# Patient Record
Sex: Female | Born: 1990 | Hispanic: No | Marital: Married | Smoking: Never smoker
Health system: Southern US, Community
[De-identification: ages and names within clinical notes are randomized; demographics above are authoritative.]

## PROBLEM LIST (undated history)

## (undated) ENCOUNTER — Inpatient Hospital Stay (HOSPITAL_COMMUNITY): Payer: Self-pay

## (undated) HISTORY — PX: BACK SURGERY: SHX140

## (undated) HISTORY — PX: CYST EXCISION: SHX5701

---

## 2010-06-09 ENCOUNTER — Ambulatory Visit: Payer: Self-pay | Admitting: Internal Medicine

## 2011-05-03 ENCOUNTER — Observation Stay: Payer: Self-pay

## 2011-07-01 ENCOUNTER — Emergency Department: Payer: Self-pay | Admitting: Emergency Medicine

## 2011-07-29 ENCOUNTER — Observation Stay: Payer: Self-pay | Admitting: Obstetrics and Gynecology

## 2011-08-14 ENCOUNTER — Observation Stay: Payer: Self-pay

## 2011-09-01 ENCOUNTER — Inpatient Hospital Stay: Payer: Self-pay | Admitting: Obstetrics and Gynecology

## 2013-11-09 ENCOUNTER — Encounter (HOSPITAL_COMMUNITY): Payer: Self-pay | Admitting: Emergency Medicine

## 2013-11-09 ENCOUNTER — Emergency Department (HOSPITAL_COMMUNITY)
Admission: EM | Admit: 2013-11-09 | Discharge: 2013-11-09 | Disposition: A | Discharge status: Home / Self Care | Payer: 59 | Source: Home / Self Care | Attending: Family Medicine | Admitting: Family Medicine

## 2013-11-09 DIAGNOSIS — M778 Other enthesopathies, not elsewhere classified: Secondary | ICD-10-CM

## 2013-11-09 DIAGNOSIS — M779 Enthesopathy, unspecified: Secondary | ICD-10-CM

## 2013-11-09 DIAGNOSIS — M2580 Other specified joint disorders, unspecified joint: Secondary | ICD-10-CM

## 2013-11-09 DIAGNOSIS — M659 Synovitis and tenosynovitis, unspecified: Secondary | ICD-10-CM

## 2013-11-09 DIAGNOSIS — M948X9 Other specified disorders of cartilage, unspecified sites: Secondary | ICD-10-CM

## 2013-11-09 MED ORDER — ETODOLAC 500 MG PO TABS: 500.0000 mg | ORAL_TABLET | Freq: Two times a day (BID) | ORAL | Status: DC

## 2013-11-09 NOTE — ED Notes (Signed)
Pt triaged and assessed by provider.   Provider in before nurse. 

## 2013-11-09 NOTE — Discharge Instructions (Signed)
Tendinitis °Tendinitis is swelling and inflammation of the tendons. Tendons are band-like tissues that connect muscle to bone. Tendinitis commonly occurs in the:  °· Shoulders (rotator cuff). °· Heels (Achilles tendon). °· Elbows (triceps tendon). °CAUSES °Tendinitis is usually caused by overusing the tendon, muscles, and joints involved. When the tissue surrounding a tendon (synovium) becomes inflamed, it is called tenosynovitis. Tendinitis commonly develops in people whose jobs require repetitive motions. °SYMPTOMS °· Pain. °· Tenderness. °· Mild swelling. °DIAGNOSIS °Tendinitis is usually diagnosed by physical exam. Your caregiver may also order X-rays or other imaging tests. °TREATMENT °Your caregiver may recommend certain medicines or exercises for your treatment. °HOME CARE INSTRUCTIONS  °· Use a sling or splint for as long as directed by your caregiver until the pain decreases. °· Put ice on the injured area. °· Put ice in a plastic bag. °· Place a towel between your skin and the bag. °· Leave the ice on for 15-20 minutes, 03-04 times a day. °· Avoid using the limb while the tendon is painful. Perform gentle range of motion exercises only as directed by your caregiver. Stop exercises if pain or discomfort increase, unless directed otherwise by your caregiver. °· Only take over-the-counter or prescription medicines for pain, discomfort, or fever as directed by your caregiver. °SEEK MEDICAL CARE IF:  °· Your pain and swelling increase. °· You develop new, unexplained symptoms, especially increased numbness in the hands. °MAKE SURE YOU:  °· Understand these instructions. °· Will watch your condition. °· Will get help right away if you are not doing well or get worse. °Document Released: 09/03/2000 Document Revised: 11/29/2011 Document Reviewed: 11/23/2010 °[employer]® Patient Information ©2014 [employer]. ° °

## 2013-11-09 NOTE — ED Provider Notes (Signed)
CSN: [id number]     Arrival date & time 11/09/13  1732 History   First MD Initiated Contact with Patient 11/09/13 1827     Chief Complaint  Patient presents with  . Foot Pain     (Consider location/radiation/quality/duration/timing/severity/associated sxs/prior Treatment) HPI Comments: 23 year old female presents complaining of left foot pain. For 2 days, she has had progressively worsening pain under the distal first metatarsal of the left foot. The pain radiates proximally up her foot to the mid arch. Her pain is somewhat relieved by walking on the side of her foot. She has had something similar to this on her other foot a few months ago but it never got this bad and went away without treatment. She states that she is on her feet a lot at work. She denies any new shoes. Denies any known injury. She feels like there is some mild swelling in her foot. She has tried elevating the foot and using ibuprofen without any significant relief of her symptoms.   History reviewed. No pertinent past medical history. Past Surgical History  Procedure Laterality Date  . Back surgery    . Cesarean section     History reviewed. No pertinent family history. History  Substance Use Topics  . Smoking status: Never Smoker   . Smokeless tobacco: Not on file  . Alcohol Use: No   OB History   Grav Para Term Preterm Abortions TAB SAB Ect Mult Living                 Review of Systems  Constitutional: Negative for fever and chills.  Eyes: Negative for visual disturbance.  Respiratory: Negative for cough and shortness of breath.   Cardiovascular: Negative for chest pain, palpitations and leg swelling.  Gastrointestinal: Negative for nausea, vomiting and abdominal pain.  Endocrine: Negative for polydipsia and polyuria.  Genitourinary: Negative for dysuria, urgency and frequency.  Musculoskeletal:       See history of present illness  Skin: Negative for rash.  Neurological: Negative for dizziness,  weakness and light-headedness.      Allergies  Tbc  Home Medications   Current Outpatient Rx  Name  Route  Sig  Dispense  Refill  . etodolac (LODINE) 500 MG tablet   Oral   Take 1 tablet (500 mg total) by mouth 2 (two) times daily.   30 tablet   1    BP 104/69  Pulse 72  Temp(Src) 98.4 F (36.9 C) (Oral)  Resp 14  SpO2 98%  LMP 10/18/2013 Physical Exam  Nursing note and vitals reviewed. Constitutional: She is oriented to person, place, and time. Vital signs are normal. She appears well-developed and well-nourished. No distress.  HENT:  Head: Normocephalic and atraumatic.  Pulmonary/Chest: Effort normal. No respiratory distress.  Musculoskeletal:       Left foot: She exhibits tenderness (tenderness directly under the head of the distal first metatarsal. Mild tenderness with palpation of the flexor hallucis longus tendon with extension of the great toe) and swelling (very minimal, around the distal first metatarsal). She exhibits normal range of motion, normal capillary refill and no deformity.  Neurological: She is alert and oriented to person, place, and time. She has normal strength. Coordination normal.  Skin: Skin is warm and dry. No rash noted. She is not diaphoretic.  Psychiatric: She has a normal mood and affect. Judgment normal.    ED Course  Procedures (including critical care time) Labs Review Labs Reviewed - No data to display Imaging Review No  results found.    MDM   Final diagnoses:  Sesamoiditis  Flexor hallucis longus tendinitis    Discussed with Dr. Denyse Amass, will put in a postop shoe and referred to orthopedics/sports medicine, prescribed etodolac to take twice daily. Advised ice and elevation.     Meds ordered this encounter  Medications  . etodolac (LODINE) 500 MG tablet    Sig: Take 1 tablet (500 mg total) by mouth 2 (two) times daily.    Dispense:  30 tablet    Refill:  1    Order Specific Question:  Supervising Provider    Answer:   Clementeen Graham, Kathie Rhodes [[id number]]      Graylon Good, PA-C 11/10/13 [phone]

## 2013-11-11 NOTE — ED Provider Notes (Signed)
Medical screening examination/treatment/procedure(s) were performed by a resident physician or non-physician practitioner and as the supervising physician I was immediately available for consultation/collaboration.  Shamyia Grandpre, MD    Lalanya Rufener S Dale Ribeiro, MD 11/11/13 0832 

## 2014-02-05 LAB — OB RESULTS CONSOLE ABO/RH: RH Type: POSITIVE

## 2014-02-05 LAB — OB RESULTS CONSOLE HIV ANTIBODY (ROUTINE TESTING): HIV: NONREACTIVE

## 2014-02-05 LAB — OB RESULTS CONSOLE HGB/HCT, BLOOD: Hemoglobin: 13.5 g/dL

## 2014-02-05 LAB — OB RESULTS CONSOLE HEPATITIS B SURFACE ANTIGEN: HEP B S AG: NEGATIVE

## 2014-02-05 LAB — OB RESULTS CONSOLE RUBELLA ANTIBODY, IGM: Rubella: IMMUNE

## 2014-02-05 LAB — OB RESULTS CONSOLE GC/CHLAMYDIA
CHLAMYDIA, DNA PROBE: NEGATIVE
GC PROBE AMP, GENITAL: NEGATIVE

## 2014-03-28 ENCOUNTER — Emergency Department (HOSPITAL_COMMUNITY)
Admission: EM | Admit: 2014-03-28 | Discharge: 2014-03-29 | Disposition: A | Discharge status: Home / Self Care | Payer: 59 | Attending: Emergency Medicine | Admitting: Emergency Medicine

## 2014-03-28 ENCOUNTER — Encounter (HOSPITAL_COMMUNITY): Payer: Self-pay | Admitting: Emergency Medicine

## 2014-03-28 DIAGNOSIS — K8020 Calculus of gallbladder without cholecystitis without obstruction: Secondary | ICD-10-CM | POA: Insufficient documentation

## 2014-03-28 DIAGNOSIS — O9989 Other specified diseases and conditions complicating pregnancy, childbirth and the puerperium: Secondary | ICD-10-CM | POA: Insufficient documentation

## 2014-03-28 DIAGNOSIS — K8050 Calculus of bile duct without cholangitis or cholecystitis without obstruction: Secondary | ICD-10-CM

## 2014-03-28 DIAGNOSIS — Z79899 Other long term (current) drug therapy: Secondary | ICD-10-CM | POA: Insufficient documentation

## 2014-03-28 LAB — BASIC METABOLIC PANEL
ANION GAP: 15 (ref 5–15)
BUN: 5 mg/dL — ABNORMAL LOW (ref 6–23)
CO2: 23 mEq/L (ref 19–32)
Calcium: 9.3 mg/dL (ref 8.4–10.5)
Chloride: 98 mEq/L (ref 96–112)
Creatinine, Ser: 0.53 mg/dL (ref 0.50–1.10)
GFR calc Af Amer: >90 mL/min (ref >90)
GLUCOSE: 89 mg/dL (ref 70–99)
Potassium: 3.9 mEq/L (ref 3.7–5.3)
SODIUM: 136 meq/L — AB (ref 137–147)

## 2014-03-28 LAB — CBC
HCT: 38.6 % (ref 36.0–46.0)
HEMOGLOBIN: 13.5 g/dL (ref 12.0–15.0)
MCH: 31.9 pg (ref 26.0–34.0)
MCHC: 35 g/dL (ref 30.0–36.0)
MCV: 91.3 fL (ref 78.0–100.0)
Platelets: 304 10*3/uL (ref 150–400)
RBC: 4.23 MIL/uL (ref 3.87–5.11)
RDW: 12.4 % (ref 11.5–15.5)
WBC: 12.8 10*3/uL — ABNORMAL HIGH (ref 4.0–10.5)

## 2014-03-28 LAB — I-STAT TROPONIN, ED: Troponin i, poc: 0 ng/mL (ref 0.00–0.08)

## 2014-03-28 NOTE — ED Notes (Signed)
Pt also reports that she is [redacted] weeks pregnant

## 2014-03-28 NOTE — ED Notes (Signed)
Pt states that she began to have right sided chest pain under rt rib area below the breast area that radiates to her neck around 7pm; pt states that the pain is worse when she takes a deep breathe; pt states that she feels short of breath; pt states that she was driving when it began and has been persistent since then

## 2014-03-29 ENCOUNTER — Emergency Department (HOSPITAL_COMMUNITY): Payer: 59

## 2014-03-29 LAB — HEPATIC FUNCTION PANEL
ALBUMIN: 3.5 g/dL (ref 3.5–5.2)
ALT: 13 U/L (ref 0–35)
AST: 19 U/L (ref 0–37)
Alkaline Phosphatase: 72 U/L (ref 39–117)
BILIRUBIN TOTAL: 0.2 mg/dL — AB (ref 0.3–1.2)
Total Protein: 7.3 g/dL (ref 6.0–8.3)

## 2014-03-29 LAB — LIPASE, BLOOD: Lipase: 50 U/L (ref 11–59)

## 2014-03-29 MED ORDER — PROMETHAZINE HCL 25 MG PO TABS: 25.0000 mg | ORAL_TABLET | Freq: Four times a day (QID) | ORAL | Status: DC | PRN

## 2014-03-29 MED ORDER — ACETAMINOPHEN 325 MG PO TABS
650.0000 mg | ORAL_TABLET | Freq: Once | ORAL | Status: AC
  Administered 2014-03-29: 650 mg via ORAL
  Filled 2014-03-29: qty 2

## 2014-03-29 NOTE — Discharge Instructions (Signed)
Your ultrasound today showed some sludge in your gallbladder, but no gallstones.  No signs of gallbladder infection.  Stick to a low-fat diet.  Follow up with your primary care Dr. and/or your OB for further workup of this ongoing pain.  Return to the emergency room for worsening condition or new concerning symptoms.   Biliary Colic  Biliary colic is a steady or irregular pain in the upper abdomen. It is usually under the right side of the rib cage. It happens when gallstones interfere with the normal flow of bile from the gallbladder. Bile is a liquid that helps to digest fats. Bile is made in the liver and stored in the gallbladder. When you eat a meal, bile passes from the gallbladder through the cystic duct and the common bile duct into the small intestine. There, it mixes with partially digested food. If a gallstone blocks either of these ducts, the normal flow of bile is blocked. The muscle cells in the bile duct contract forcefully to try to move the stone. This causes the pain of biliary colic.  SYMPTOMS   A person with biliary colic usually complains of pain in the upper abdomen. This pain can be:  In the center of the upper abdomen just below the breastbone.  In the upper-right part of the abdomen, near the gallbladder and liver.  Spread back toward the right shoulder blade.  Nausea and vomiting.  The pain usually occurs after eating.  Biliary colic is usually triggered by the digestive system's demand for bile. The demand for bile is high after fatty meals. Symptoms can also occur when a person who has been fasting suddenly eats a very large meal. Most episodes of biliary colic pass after 1 to 5 hours. After the most intense pain passes, your abdomen may continue to ache mildly for about 24 hours. DIAGNOSIS  After you describe your symptoms, your caregiver will perform a physical exam. He or she will pay attention to the upper right portion of your belly (abdomen). This is the area of  your liver and gallbladder. An ultrasound will help your caregiver look for gallstones. Specialized scans of the gallbladder may also be done. Blood tests may be done, especially if you have fever or if your pain persists. PREVENTION  Biliary colic can be prevented by controlling the risk factors for gallstones. Some of these risk factors, such as heredity, increasing age, and pregnancy are a normal part of life. Obesity and a high-fat diet are risk factors you can change through a healthy lifestyle. Women going through menopause who take hormone replacement therapy (estrogen) are also more likely to develop biliary colic. TREATMENT   Pain medication may be prescribed.  You may be encouraged to eat a fat-free diet.  If the first episode of biliary colic is severe, or episodes of colic keep retuning, surgery to remove the gallbladder (cholecystectomy) is usually recommended. This procedure can be done through small incisions using an instrument called a laparoscope. The procedure often requires a brief stay in the hospital. Some people can leave the hospital the same day. It is the most widely used treatment in people troubled by painful gallstones. It is effective and safe, with no complications in more than 90% of cases.  If surgery cannot be done, medication that dissolves gallstones may be used. This medication is expensive and can take months or years to work. Only small stones will dissolve.  Rarely, medication to dissolve gallstones is combined with a procedure called shock-wave lithotripsy.  This procedure uses carefully aimed shock waves to break up gallstones. In many people treated with this procedure, gallstones form again within a few years. PROGNOSIS  If gallstones block your cystic duct or common bile duct, you are at risk for repeated episodes of biliary colic. There is also a 25% chance that you will develop a gallbladder infection(acute cholecystitis), or some other complication of  gallstones within 10 to 20 years. If you have surgery, schedule it at a time that is convenient for you and at a time when you are not sick. HOME CARE INSTRUCTIONS   Drink plenty of clear fluids.  Avoid fatty, greasy or fried foods, or any foods that make your pain worse.  Take medications as directed. SEEK MEDICAL CARE IF:   You develop a fever over 100.5 F (38.1 C).  Your pain gets worse over time.  You develop nausea that prevents you from eating and drinking.  You develop vomiting. SEEK IMMEDIATE MEDICAL CARE IF:   You have continuous or severe belly (abdominal) pain which is not relieved with medications.  You develop nausea and vomiting which is not relieved with medications.  You have symptoms of biliary colic and you suddenly develop a fever and shaking chills. This may signal cholecystitis. Call your caregiver immediately.  You develop a yellow color to your skin or the white part of your eyes (jaundice). Document Released: 02/07/2006 Document Revised: 11/29/2011 Document Reviewed: 04/18/2008 [facility] Patient Information 2015 [place], [place]. This information is not intended to replace advice given to you by your health care provider. Make sure you discuss any questions you have with your health care provider.  Low-Fat Diet for Pancreatitis or Gallbladder Conditions A low-fat diet can be helpful if you have pancreatitis or a gallbladder condition. With these conditions, your pancreas and gallbladder have trouble digesting fats. A healthy eating plan with less fat will help rest your pancreas and gallbladder and reduce your symptoms. WHAT DO I NEED TO KNOW ABOUT THIS DIET?  Eat a low-fat diet.  Reduce your fat intake to less than 20-30% of your total daily calories. This is less than 50-60 g of fat per day.  Remember that you need some fat in your diet. Ask your dietician what your daily goal should be.  Choose nonfat and low-fat healthy foods. Look for the words  "nonfat," "low fat," or "fat free."  As a guide, look on the label and choose foods with less than 3 g of fat per serving. Eat only one serving.  Avoid alcohol.  Do not smoke. If you need help quitting, talk with your health care provider.  Eat small frequent meals instead of three large heavy meals. WHAT FOODS CAN I EAT? Grains Include healthy grains and starches such as potatoes, wheat bread, fiber-rich cereal, and brown rice. Choose whole grain options whenever possible. In adults, whole grains should account for 45-65% of your daily calories.  Fruits and Vegetables Eat plenty of fruits and vegetables. Fresh fruits and vegetables add fiber to your diet. Meats and Other Protein Sources Eat lean meat such as chicken and pork. Trim any fat off of meat before cooking it. Eggs, fish, and beans are other sources of protein. In adults, these foods should account for 10-35% of your daily calories. Dairy Choose low-fat milk and dairy options. Dairy includes fat and protein, as well as calcium.  Fats and Oils Limit high-fat foods such as fried foods, sweets, baked goods, sugary drinks.  Other Creamy sauces and condiments, such  as mayonnaise, can add extra fat. Think about whether or not you need to use them, or use smaller amounts or low fat options. WHAT FOODS ARE NOT RECOMMENDED?  High fat foods, such as:  [employer].  Ice cream.  [place] toast.  Sweet rolls.  Pizza.  Cheese bread.  Foods covered with batter, butter, creamy sauces, or cheese.  Fried foods.  Sugary drinks and desserts.  Foods that cause gas or bloating Document Released: 09/11/2013 Document Reviewed: 08/20/2013 [facility] Patient Information 2015 [place], [place]. This information is not intended to replace advice given to you by your health care provider. Make sure you discuss any questions you have with your health care provider.

## 2014-03-29 NOTE — ED Provider Notes (Signed)
CSN: [id number]     Arrival date & time 03/28/14  2153 History   First MD Initiated Contact with Patient 03/28/14 2354     Chief Complaint  Patient presents with  . Chest Pain     (Consider location/radiation/quality/duration/timing/severity/associated sxs/prior Treatment) HPI 23 year old female presents to emergency department with complaint of right upper quadrant pain with radiation into her chest and neck today while driving home from work.  Patient reports she has had intermittent right upper quadrant pain ongoing for the last several weeks to months usually after eating.  She does not usually have radiation into her chest.  She was concerned with this pain and came to the ER.  She reports the chest and neck pain have since resolved.  While she was having the severe pain, deep breaths would cause her right upper quadrant pain to worsen.  Patient is [redacted] weeks pregnant.  She denies any problems during this pregnancy.  Patient reports no fever or chills.  She is mildly nauseated. History reviewed. No pertinent past medical history. Past Surgical History  Procedure Laterality Date  . Back surgery    . Cesarean section     No family history on file. History  Substance Use Topics  . Smoking status: Never Smoker   . Smokeless tobacco: Not on file  . Alcohol Use: No   OB History   Grav Para Term Preterm Abortions TAB SAB Ect Mult Living   1              Review of Systems  See History of Present Illness; otherwise all other systems are reviewed and negative   Allergies  Tbc  Home Medications   Prior to Admission medications   Medication Sig Start Date End Date Taking? Authorizing Provider  Doxylamine-Pyridoxine (DICLEGIS) 10-10 MG TBEC Take 1 tablet by mouth 2 (two) times daily as needed (nausea).   Yes Historical Provider, MD  Prenatal Vit-Fe Fumarate-FA (MULTIVITAMIN-PRENATAL) 27-0.8 MG TABS tablet Take 1 tablet by mouth daily at 12 noon.   Yes Historical Provider, MD   BP  99/58  Temp(Src) 98.4 F (36.9 C) (Oral)  Resp 17  Ht 5\' 6"  (1.676 m)  Wt 152 lb (68.947 kg)  BMI 24.55 kg/m2  SpO2 100%  LMP 10/18/2013 Physical Exam  Nursing note and vitals reviewed. Constitutional: She is oriented to person, place, and time. She appears well-developed and well-nourished. No distress.  HENT:  Head: Normocephalic and atraumatic.  Nose: Nose normal.  Mouth/Throat: Oropharynx is clear and moist.  Eyes: Conjunctivae and EOM are normal. Pupils are equal, round, and reactive to light.  Neck: Normal range of motion. Neck supple. No JVD present. No tracheal deviation present. No thyromegaly present.  Cardiovascular: Normal rate, regular rhythm, normal heart sounds and intact distal pulses.  Exam reveals no gallop and no friction rub.   No murmur heard. Pulmonary/Chest: Effort normal and breath sounds normal. No stridor. No respiratory distress. She has no wheezes. She has no rales. She exhibits no tenderness.  Abdominal: Soft. Bowel sounds are normal. She exhibits mass (gravid uterus). She exhibits no distension. There is tenderness (tenderness in right upper quadrant). There is no rebound and no guarding.  Musculoskeletal: Normal range of motion. She exhibits no edema and no tenderness.  Lymphadenopathy:    She has no cervical adenopathy.  Neurological: She is alert and oriented to person, place, and time. She exhibits normal muscle tone. Coordination normal.  Skin: Skin is warm and dry. No rash noted. No erythema.  No pallor.  Psychiatric: She has a normal mood and affect. Her behavior is normal. Judgment and thought content normal.    ED Course  Procedures (including critical care time) Labs Review Labs Reviewed  CBC - Abnormal; Notable for the following:    WBC 12.8 (*)    All other components within normal limits  BASIC METABOLIC PANEL - Abnormal; Notable for the following:    Sodium 136 (*)    BUN 5 (*)    All other components within normal limits  HEPATIC  FUNCTION PANEL - Abnormal; Notable for the following:    Total Bilirubin 0.2 (*)    All other components within normal limits  LIPASE, BLOOD  I-STAT TROPOININ, ED    Imaging Review [place] Abdomen Limited  03/29/2014   CLINICAL DATA:  Postprandial right upper quadrant pain.  EXAM: US ABDOMEN LIMITED - RIGHT UPPER QUADRANT  COMPARISON:  None.  FINDINGS: Gallbladder:  Layering gallbladder sludge noted, however no discrete gallstones are identified. No evidence of gallbladder wall thickening or pericholecystic fluid. No sonographic Murphy sign noted by sonographer.  Common bile duct:  Diameter: 4 mm  Liver:  No focal lesion identified. Within normal limits in parenchymal echogenicity.  IMPRESSION: Gallbladder sludge, without discrete gallstones or biliary dilatation.   Electronically Signed   By: Myles RosenthalJohn  Stahl M.D.   On: 03/29/2014 01:20     EKG Interpretation   Date/Time:  Thursday March 28 2014 22:09:22 EDT Ventricular Rate:  74 PR Interval:  137 QRS Duration: 81 QT Interval:  394 QTC Calculation: 437 R Axis:   43 Text Interpretation:  Sinus rhythm Low voltage, precordial leads No old  tracing to compare Confirmed by Rutger Salton  MD, Melchor Kirchgessner ([id number]) on 03/29/2014  12:04:09 AM      MDM   Final diagnoses:  Biliary colic    23 year old female with intermittent pain over the last several months, sounds to be biliary colic, has family history of same.  Patient is a worse, with shortness of breath and right-sided chest pain.  Patient reports all symptoms have resolved.  She is not tachycardic,tachpneic, or in any respiratory distress.  Suspect referred pain from cholelithiasis.  Plan for LFTs, right upper quadrant ultrasound.    Olivia Mackielga M Anays [place], MD 03/29/14 [id number]

## 2014-07-22 ENCOUNTER — Encounter (HOSPITAL_COMMUNITY): Payer: Self-pay | Admitting: Emergency Medicine

## 2014-08-06 ENCOUNTER — Inpatient Hospital Stay (HOSPITAL_COMMUNITY)
Admission: AD | Admit: 2014-08-06 | Discharge: 2014-08-06 | Disposition: A | Discharge status: Home / Self Care | Payer: 59 | Source: Ambulatory Visit | Attending: Obstetrics and Gynecology | Admitting: Obstetrics and Gynecology

## 2014-08-06 ENCOUNTER — Encounter (HOSPITAL_COMMUNITY): Payer: Self-pay | Admitting: *Deleted

## 2014-08-06 DIAGNOSIS — O4703 False labor before 37 completed weeks of gestation, third trimester: Secondary | ICD-10-CM | POA: Insufficient documentation

## 2014-08-06 DIAGNOSIS — Z3A35 35 weeks gestation of pregnancy: Secondary | ICD-10-CM | POA: Insufficient documentation

## 2014-08-06 DIAGNOSIS — O479 False labor, unspecified: Secondary | ICD-10-CM

## 2014-08-06 MED ORDER — ZOLPIDEM TARTRATE 5 MG PO TABS: 5.0000 mg | ORAL_TABLET | Freq: Every evening | ORAL | Status: DC | PRN

## 2014-08-06 MED ORDER — ZOLPIDEM TARTRATE 5 MG PO TABS: 5.0000 mg | ORAL_TABLET | Freq: Once | ORAL | Status: DC

## 2014-08-06 NOTE — MAU Note (Signed)
Dr. Henderson Cloudomblin given report and pt. to be given po hydration and observed for another 45 minutes.

## 2014-08-06 NOTE — MAU Note (Signed)
Received pt from home to MAU c/o contractions which started at 2200 and have been consistent throughout the night. Pt states that they are not getting stronger. Pt states baby is active, denies bleeding and leaking of fluid and is scheduled for a repeat c-section on Dec.18th.

## 2014-08-06 NOTE — Discharge Instructions (Signed)
Braxton Hicks Contractions °Contractions of the uterus can occur throughout pregnancy. Contractions are not always a sign that you are in labor.  °WHAT ARE BRAXTON HICKS CONTRACTIONS?  °Contractions that occur before labor are called Braxton Hicks contractions, or false labor. Toward the end of pregnancy (32-34 weeks), these contractions can develop more often and may become more forceful. This is not true labor because these contractions do not result in opening (dilatation) and thinning of the cervix. They are sometimes difficult to tell apart from true labor because these contractions can be forceful and people have different pain tolerances. You should not feel embarrassed if you go to the hospital with false labor. Sometimes, the only way to tell if you are in true labor is for your health care provider to look for changes in the cervix. °If there are no prenatal problems or other health problems associated with the pregnancy, it is completely safe to be sent home with false labor and await the onset of true labor. °HOW CAN YOU TELL THE DIFFERENCE BETWEEN TRUE AND FALSE LABOR? °False Labor °· The contractions of false labor are usually shorter and not as hard as those of true labor.   °· The contractions are usually irregular.   °· The contractions are often felt in the front of the lower abdomen and in the groin.   °· The contractions may go away when you walk around or change positions while lying down.   °· The contractions get weaker and are shorter lasting as time goes on.   °· The contractions do not usually become progressively stronger, regular, and closer together as with true labor.   °True Labor °· Contractions in true labor last 30-70 seconds, become very regular, usually become more intense, and increase in frequency.   °· The contractions do not go away with walking.   °· The discomfort is usually felt in the top of the uterus and spreads to the lower abdomen and low back.   °· True labor can be  determined by your health care provider with an exam. This will show that the cervix is dilating and getting thinner.   °WHAT TO REMEMBER °· Keep up with your usual exercises and follow other instructions given by your health care provider.   °· Take medicines as directed by your health care provider.   °· Keep your regular prenatal appointments.   °· Eat and drink lightly if you think you are going into labor.   °· If Braxton Hicks contractions are making you uncomfortable:   °¨ Change your position from lying down or resting to walking, or from walking to resting.   °¨ Sit and rest in a tub of warm water.   °¨ Drink 2-3 glasses of water. Dehydration may cause these contractions.   °¨ Do slow and deep breathing several times an hour.   °WHEN SHOULD I SEEK IMMEDIATE MEDICAL CARE? °Seek immediate medical care if: °· Your contractions become stronger, more regular, and closer together.   °· You have fluid leaking or gushing from your vagina.   °· You have a fever.   °· You pass blood-tinged mucus.   °· You have vaginal bleeding.   °· You have continuous abdominal pain.   °· You have low back pain that you never had before.   °· You feel your baby's head pushing down and causing pelvic pressure.   °· Your baby is not moving as much as it used to.   °Document Released: 09/06/2005 Document Revised: 09/11/2013 Document Reviewed: 06/18/2013 °ExitCare® Patient Information ©2015 [employer]. This information is not intended to replace advice given to you by your health care   provider. Make sure you discuss any questions you have with your health care provider. ° °

## 2014-08-06 NOTE — MAU Provider Note (Signed)
  History     CSN: [id number]  Arrival date and time: 08/06/14 0609   None     Chief Complaint  Patient presents with  . Labor Eval   HPI  [name] is a. 23 y.o. G2P1 at 348w2d who presents today with contractions. She states that the contractions started last night at 0000. She denies any VB or LOF and confirms fetal movement. She is scheduled for a c-section in December. She states that the contractions have not gotten stronger since starting at 0000, nor have they gotten less intense.   History reviewed. No pertinent past medical history.  Past Surgical History  Procedure Laterality Date  . Cyst excision Bilateral     spinal    History reviewed. No pertinent family history.  History  Substance Use Topics  . Smoking status: Never Smoker   . Smokeless tobacco: Not on file  . Alcohol Use: No    Allergies: Not on File  Prescriptions prior to admission  Medication Sig Dispense Refill Last Dose  . ferrous sulfate 325 (65 FE) MG tablet Take 325 mg by mouth daily with breakfast.     . pantoprazole (PROTONIX) 20 MG tablet Take 20 mg by mouth daily.     . Prenatal Vit-Fe Fumarate-FA (PRENATAL MULTIVITAMIN) TABS tablet Take 1 tablet by mouth daily at 12 noon.       ROS Physical Exam   Blood pressure 105/62, pulse 94, temperature 97.4 F (36.3 C), temperature source Oral, resp. rate 18, height 5\' 5"  (1.651 m), weight 81.647 kg (180 lb), last menstrual period 11/22/2013.  Physical Exam  Nursing note and vitals reviewed. Constitutional: She is oriented to person, place, and time. She appears well-developed and well-nourished. No distress.  Cardiovascular: Normal rate.   Respiratory: Effort normal.  GI: Soft. There is no tenderness. There is no rebound.  Genitourinary:  Cervix: closed/thick/-2   Neurological: She is alert and oriented to person, place, and time.  Skin: Skin is warm and dry.  Psychiatric: She has a normal mood and affect.   FHT 130,  moderate with 15x15 accels, no decels Toco: irregular contractions, about every 3-8 mins  MAU Course  Procedures  0744: Left message with Dr. Marcelle OverlieHolland [phone]: D/W Dr. Henderson Cloudomblin, ok for dc home with Remus Lofflerambien for sleep. Patient does not want ambien at this time. Will send home with RX for #5 PRN.   Assessment and Plan   1. Labor, false (Braxton-Hicks), antepartum    Comfort measures reviewed Labor precautions  Fetal kick counts Return to MAU as needed  Follow-up Information    Follow up with Meriel PicaHOLLAND,RICHARD M, MD.   Specialty:  Obstetrics and Gynecology   Why:  As scheduled   Contact information:   [address] [place] [address] [phone]       Tawnya CrookHogan, Sona Nations Donovan 08/06/2014, 7:37 AM

## 2014-09-03 ENCOUNTER — Encounter (HOSPITAL_COMMUNITY): Payer: Self-pay | Admitting: Emergency Medicine

## 2014-09-03 ENCOUNTER — Encounter (HOSPITAL_COMMUNITY): Payer: Self-pay

## 2014-09-04 ENCOUNTER — Encounter (HOSPITAL_COMMUNITY)
Admission: AD | Disposition: A | Discharge status: Home / Self Care | Payer: Self-pay | Source: Ambulatory Visit | Attending: Obstetrics and Gynecology

## 2014-09-04 ENCOUNTER — Inpatient Hospital Stay (HOSPITAL_COMMUNITY)
Admission: AD | Admit: 2014-09-04 | Discharge: 2014-09-06 | DRG: 766 | Disposition: A | Discharge status: Home / Self Care | Payer: 59 | Source: Ambulatory Visit | Attending: Obstetrics and Gynecology | Admitting: Obstetrics and Gynecology

## 2014-09-04 ENCOUNTER — Inpatient Hospital Stay (HOSPITAL_COMMUNITY): Payer: 59 | Admitting: Anesthesiology

## 2014-09-04 ENCOUNTER — Encounter (HOSPITAL_COMMUNITY): Payer: Self-pay | Admitting: Anesthesiology

## 2014-09-04 DIAGNOSIS — Z3A39 39 weeks gestation of pregnancy: Secondary | ICD-10-CM | POA: Diagnosis present

## 2014-09-04 DIAGNOSIS — O3421 Maternal care for scar from previous cesarean delivery: Secondary | ICD-10-CM | POA: Diagnosis present

## 2014-09-04 DIAGNOSIS — Z3490 Encounter for supervision of normal pregnancy, unspecified, unspecified trimester: Secondary | ICD-10-CM

## 2014-09-04 LAB — CBC
HCT: 32.9 % — ABNORMAL LOW (ref 36.0–46.0)
HCT: 31.1 % — ABNORMAL LOW (ref 36.0–46.0)
Hemoglobin: 10.3 g/dL — ABNORMAL LOW (ref 12.0–15.0)
Hemoglobin: 10.1 g/dL — ABNORMAL LOW (ref 12.0–15.0)
MCH: 25.7 pg — ABNORMAL LOW (ref 26.0–34.0)
MCH: 26.6 pg (ref 26.0–34.0)
MCHC: 31.3 g/dL (ref 30.0–36.0)
MCHC: 32.5 g/dL (ref 30.0–36.0)
MCV: 82 fL (ref 78.0–100.0)
MCV: 81.8 fL (ref 78.0–100.0)
PLATELETS: 299 10*3/uL (ref 150–400)
Platelets: 279 10*3/uL (ref 150–400)
RBC: 4.01 MIL/uL (ref 3.87–5.11)
RBC: 3.8 MIL/uL — ABNORMAL LOW (ref 3.87–5.11)
RDW: 14.4 % (ref 11.5–15.5)
RDW: 14.3 % (ref 11.5–15.5)
WBC: 12.9 10*3/uL — AB (ref 4.0–10.5)
WBC: 23 10*3/uL — ABNORMAL HIGH (ref 4.0–10.5)

## 2014-09-04 LAB — TYPE AND SCREEN
ABO/RH(D): A POS
Antibody Screen: NEGATIVE

## 2014-09-04 LAB — ABO/RH: ABO/RH(D): A POS

## 2014-09-04 LAB — HIV ANTIBODY (ROUTINE TESTING W REFLEX): HIV: NONREACTIVE

## 2014-09-04 LAB — RPR

## 2014-09-04 SURGERY — Surgical Case
Anesthesia: Spinal

## 2014-09-04 MED ORDER — KETOROLAC TROMETHAMINE 30 MG/ML IJ SOLN: 30.0000 mg | Freq: Four times a day (QID) | INTRAMUSCULAR | Status: AC | PRN

## 2014-09-04 MED ORDER — NALOXONE HCL 0.4 MG/ML IJ SOLN: 0.4000 mg | INTRAMUSCULAR | Status: DC | PRN

## 2014-09-04 MED ORDER — NALBUPHINE HCL 10 MG/ML IJ SOLN
5.0000 mg | Freq: Once | INTRAMUSCULAR | Status: AC | PRN
Start: 2014-09-04 — End: 2014-09-04

## 2014-09-04 MED ORDER — LACTATED RINGERS IV SOLN: INTRAVENOUS | Status: DC | PRN

## 2014-09-04 MED ORDER — OXYTOCIN 10 UNIT/ML IJ SOLN: 40.0000 [IU] | INTRAVENOUS | Status: DC | PRN

## 2014-09-04 MED ORDER — DIBUCAINE 1 % RE OINT
1.0000 | TOPICAL_OINTMENT | RECTAL | Status: DC | PRN
Start: 2014-09-04 — End: 2014-09-06

## 2014-09-04 MED ORDER — ONDANSETRON HCL 4 MG PO TABS: 4.0000 mg | ORAL_TABLET | ORAL | Status: DC | PRN

## 2014-09-04 MED ORDER — ONDANSETRON HCL 4 MG/2ML IJ SOLN
INTRAMUSCULAR | Status: DC | PRN
  Administered 2014-09-04: 4 mg via INTRAVENOUS

## 2014-09-04 MED ORDER — SCOPOLAMINE 1 MG/3DAYS TD PT72
MEDICATED_PATCH | TRANSDERMAL | Status: DC | PRN
  Administered 2014-09-04: 1 via TRANSDERMAL

## 2014-09-04 MED ORDER — ONDANSETRON HCL 4 MG/2ML IJ SOLN: 4.0000 mg | Freq: Three times a day (TID) | INTRAMUSCULAR | Status: DC | PRN

## 2014-09-04 MED ORDER — CITRIC ACID-SODIUM CITRATE 334-500 MG/5ML PO SOLN
30.0000 mL | Freq: Once | ORAL | Status: AC
  Administered 2014-09-04: 30 mL via ORAL
  Filled 2014-09-04: qty 15

## 2014-09-04 MED ORDER — PHENYLEPHRINE HCL 10 MG/ML IJ SOLN: 0.8000 mg | Freq: Once | INTRAMUSCULAR | Status: DC

## 2014-09-04 MED ORDER — PHENYLEPHRINE HCL 10 MG/ML IJ SOLN: 80.0000 ug | Freq: Once | INTRAMUSCULAR | Status: DC

## 2014-09-04 MED ORDER — NALBUPHINE HCL 10 MG/ML IJ SOLN
5.0000 mg | INTRAMUSCULAR | Status: DC | PRN
  Administered 2014-09-04 – 2014-09-05 (×3): 5 mg via SUBCUTANEOUS
  Filled 2014-09-04 (×4): qty 1

## 2014-09-04 MED ORDER — SODIUM CHLORIDE 0.9 % IJ SOLN
INTRAMUSCULAR | Status: AC
  Filled 2014-09-04: qty 20

## 2014-09-04 MED ORDER — PRENATAL MULTIVITAMIN CH
1.0000 | ORAL_TABLET | Freq: Every day | ORAL | Status: DC
  Administered 2014-09-04 – 2014-09-05 (×2): 1 via ORAL
  Filled 2014-09-04 (×2): qty 1

## 2014-09-04 MED ORDER — ZOLPIDEM TARTRATE 5 MG PO TABS: 5.0000 mg | ORAL_TABLET | Freq: Every evening | ORAL | Status: DC | PRN

## 2014-09-04 MED ORDER — LACTATED RINGERS IV SOLN
INTRAVENOUS | Status: DC | PRN
  Administered 2014-09-04 (×3): via INTRAVENOUS

## 2014-09-04 MED ORDER — PHENYLEPHRINE 8 MG IN D5W 100 ML (0.08MG/ML) PREMIX OPTIME
INJECTION | INTRAVENOUS | Status: DC | PRN
  Administered 2014-09-04: 80 ug/min via INTRAVENOUS

## 2014-09-04 MED ORDER — SODIUM CHLORIDE 0.9 % IJ SOLN: 3.0000 mL | INTRAMUSCULAR | Status: DC | PRN

## 2014-09-04 MED ORDER — SIMETHICONE 80 MG PO CHEW
80.0000 mg | CHEWABLE_TABLET | ORAL | Status: DC
  Administered 2014-09-04 – 2014-09-05 (×2): 80 mg via ORAL
  Filled 2014-09-04 (×2): qty 1

## 2014-09-04 MED ORDER — METHYLERGONOVINE MALEATE 0.2 MG/ML IJ SOLN
INTRAMUSCULAR | Status: AC
  Filled 2014-09-04: qty 1

## 2014-09-04 MED ORDER — TETANUS-DIPHTH-ACELL PERTUSSIS 5-2.5-18.5 LF-MCG/0.5 IM SUSP
0.5000 mL | Freq: Once | INTRAMUSCULAR | Status: DC
Start: 2014-09-05 — End: 2014-09-06

## 2014-09-04 MED ORDER — SENNOSIDES-DOCUSATE SODIUM 8.6-50 MG PO TABS
2.0000 | ORAL_TABLET | ORAL | Status: DC
  Administered 2014-09-04 – 2014-09-05 (×2): 2 via ORAL
  Filled 2014-09-04 (×2): qty 2

## 2014-09-04 MED ORDER — IBUPROFEN 600 MG PO TABS
600.0000 mg | ORAL_TABLET | Freq: Four times a day (QID) | ORAL | Status: DC
  Administered 2014-09-04 – 2014-09-06 (×8): 600 mg via ORAL
  Filled 2014-09-04 (×8): qty 1

## 2014-09-04 MED ORDER — LANOLIN HYDROUS EX OINT: 1.0000 "application " | TOPICAL_OINTMENT | CUTANEOUS | Status: DC | PRN

## 2014-09-04 MED ORDER — WITCH HAZEL-GLYCERIN EX PADS: 1.0000 "application " | MEDICATED_PAD | CUTANEOUS | Status: DC | PRN

## 2014-09-04 MED ORDER — NALBUPHINE HCL 10 MG/ML IJ SOLN
5.0000 mg | Freq: Once | INTRAMUSCULAR | Status: AC | PRN
  Administered 2014-09-04: 5 mg via SUBCUTANEOUS

## 2014-09-04 MED ORDER — MORPHINE SULFATE (PF) 0.5 MG/ML IJ SOLN
INTRAMUSCULAR | Status: DC | PRN
  Administered 2014-09-04: .15 mg via EPIDURAL

## 2014-09-04 MED ORDER — OXYTOCIN 10 UNIT/ML IJ SOLN
40.0000 [IU] | INTRAVENOUS | Status: DC | PRN
  Administered 2014-09-04: 40 [IU] via INTRAVENOUS

## 2014-09-04 MED ORDER — DIPHENHYDRAMINE HCL 25 MG PO CAPS
25.0000 mg | ORAL_CAPSULE | Freq: Four times a day (QID) | ORAL | Status: DC | PRN
  Administered 2014-09-05 (×2): 25 mg via ORAL
  Filled 2014-09-04 (×3): qty 1

## 2014-09-04 MED ORDER — METHYLERGONOVINE MALEATE 0.2 MG/ML IJ SOLN
INTRAMUSCULAR | Status: AC
  Administered 2014-09-04: 0.2 mg via INTRAMUSCULAR
  Filled 2014-09-04: qty 1

## 2014-09-04 MED ORDER — NALBUPHINE HCL 10 MG/ML IJ SOLN: 5.0000 mg | INTRAMUSCULAR | Status: DC | PRN

## 2014-09-04 MED ORDER — PHENYLEPHRINE 40 MCG/ML (10ML) SYRINGE FOR IV PUSH (FOR BLOOD PRESSURE SUPPORT)
80.0000 ug | PREFILLED_SYRINGE | Freq: Once | INTRAVENOUS | Status: AC
  Administered 2014-09-04: 80 ug via INTRAVENOUS
  Filled 2014-09-04: qty 2

## 2014-09-04 MED ORDER — DIPHENHYDRAMINE HCL 25 MG PO CAPS
25.0000 mg | ORAL_CAPSULE | ORAL | Status: DC | PRN
  Administered 2014-09-05: 25 mg via ORAL
  Filled 2014-09-04: qty 1

## 2014-09-04 MED ORDER — SIMETHICONE 80 MG PO CHEW: 80.0000 mg | CHEWABLE_TABLET | ORAL | Status: DC | PRN

## 2014-09-04 MED ORDER — BUPIVACAINE LIPOSOME 1.3 % IJ SUSP
20.0000 mL | Freq: Once | INTRAMUSCULAR | Status: DC
  Filled 2014-09-04: qty 20

## 2014-09-04 MED ORDER — ONDANSETRON HCL 4 MG/2ML IJ SOLN
4.0000 mg | INTRAMUSCULAR | Status: DC | PRN
  Administered 2014-09-04: 4 mg via INTRAVENOUS
  Filled 2014-09-04: qty 2

## 2014-09-04 MED ORDER — OXYCODONE-ACETAMINOPHEN 5-325 MG PO TABS
1.0000 | ORAL_TABLET | ORAL | Status: DC | PRN
  Administered 2014-09-04 – 2014-09-06 (×2): 1 via ORAL
  Filled 2014-09-04: qty 1

## 2014-09-04 MED ORDER — BUPIVACAINE IN DEXTROSE 0.75-8.25 % IT SOLN
INTRATHECAL | Status: DC | PRN
  Administered 2014-09-04: 1.6 mL via INTRATHECAL

## 2014-09-04 MED ORDER — FENTANYL CITRATE 0.05 MG/ML IJ SOLN: 25.0000 ug | INTRAMUSCULAR | Status: DC | PRN

## 2014-09-04 MED ORDER — SCOPOLAMINE 1 MG/3DAYS TD PT72
1.0000 | MEDICATED_PATCH | Freq: Once | TRANSDERMAL | Status: DC
  Filled 2014-09-04: qty 1

## 2014-09-04 MED ORDER — DIPHENHYDRAMINE HCL 50 MG/ML IJ SOLN: 12.5000 mg | INTRAMUSCULAR | Status: DC | PRN

## 2014-09-04 MED ORDER — OXYCODONE-ACETAMINOPHEN 5-325 MG PO TABS
2.0000 | ORAL_TABLET | ORAL | Status: DC | PRN
  Administered 2014-09-05 – 2014-09-06 (×7): 2 via ORAL
  Filled 2014-09-04 (×9): qty 2

## 2014-09-04 MED ORDER — LACTATED RINGERS IV BOLUS (SEPSIS): 500.0000 mL | Freq: Once | INTRAVENOUS | Status: DC

## 2014-09-04 MED ORDER — MEPERIDINE HCL 25 MG/ML IJ SOLN: 6.2500 mg | INTRAMUSCULAR | Status: DC | PRN

## 2014-09-04 MED ORDER — BUPIVACAINE LIPOSOME 1.3 % IJ SUSP
INTRAMUSCULAR | Status: DC | PRN
  Administered 2014-09-04: 20 mL

## 2014-09-04 MED ORDER — OXYTOCIN 40 UNITS IN LACTATED RINGERS INFUSION - SIMPLE MED: 62.5000 mL/h | INTRAVENOUS | Status: AC

## 2014-09-04 MED ORDER — LACTATED RINGERS IV SOLN
INTRAVENOUS | Status: DC
  Administered 2014-09-04: 17:00:00 via INTRAVENOUS

## 2014-09-04 MED ORDER — METHYLERGONOVINE MALEATE 0.2 MG/ML IJ SOLN
0.2000 mg | Freq: Once | INTRAMUSCULAR | Status: AC
  Administered 2014-09-04: 0.2 mg via INTRAMUSCULAR

## 2014-09-04 MED ORDER — SIMETHICONE 80 MG PO CHEW
80.0000 mg | CHEWABLE_TABLET | Freq: Three times a day (TID) | ORAL | Status: DC
  Administered 2014-09-04 – 2014-09-06 (×7): 80 mg via ORAL
  Filled 2014-09-04 (×5): qty 1

## 2014-09-04 MED ORDER — FENTANYL CITRATE 0.05 MG/ML IJ SOLN
INTRAMUSCULAR | Status: DC | PRN
  Administered 2014-09-04: 25 ug via INTRATHECAL

## 2014-09-04 MED ORDER — CEFAZOLIN SODIUM-DEXTROSE 2-3 GM-% IV SOLR
2.0000 g | INTRAVENOUS | Status: AC
  Administered 2014-09-04: 2 g via INTRAVENOUS

## 2014-09-04 MED ORDER — NALOXONE HCL 1 MG/ML IJ SOLN
1.0000 ug/kg/h | INTRAVENOUS | Status: DC | PRN
  Filled 2014-09-04: qty 2

## 2014-09-04 MED ORDER — MENTHOL 3 MG MT LOZG: 1.0000 | LOZENGE | OROMUCOSAL | Status: DC | PRN

## 2014-09-04 SURGICAL SUPPLY — 41 items
BENZOIN TINCTURE PRP APPL 2/3 (GAUZE/BANDAGES/DRESSINGS) ×3 IMPLANT
CLAMP CORD UMBIL (MISCELLANEOUS) IMPLANT
CLOSURE STERI-STRIP 1/2X4 (GAUZE/BANDAGES/DRESSINGS) ×1
CLOSURE WOUND 1/2 X4 (GAUZE/BANDAGES/DRESSINGS)
CLOTH BEACON ORANGE TIMEOUT ST (SAFETY) ×3 IMPLANT
CLSR STERI-STRIP ANTIMIC 1/2X4 (GAUZE/BANDAGES/DRESSINGS) ×2 IMPLANT
CONTAINER PREFILL 10% NBF 15ML (MISCELLANEOUS) IMPLANT
DRAPE SHEET LG 3/4 BI-LAMINATE (DRAPES) IMPLANT
DRSG OPSITE POSTOP 4X10 (GAUZE/BANDAGES/DRESSINGS) ×3 IMPLANT
DURAPREP 26ML APPLICATOR (WOUND CARE) ×3 IMPLANT
ELECT REM PT RETURN 9FT ADLT (ELECTROSURGICAL) ×3
ELECTRODE REM PT RTRN 9FT ADLT (ELECTROSURGICAL) ×1 IMPLANT
EXTRACTOR VACUUM M CUP 4 TUBE (SUCTIONS) IMPLANT
EXTRACTOR VACUUM M CUP 4' TUBE (SUCTIONS)
GLOVE BIO SURGEON STRL SZ7.5 (GLOVE) ×3 IMPLANT
GOWN STRL REUS W/TWL LRG LVL3 (GOWN DISPOSABLE) ×6 IMPLANT
KIT ABG SYR 3ML LUER SLIP (SYRINGE) ×3 IMPLANT
LIQUID BAND (GAUZE/BANDAGES/DRESSINGS) IMPLANT
NEEDLE HYPO 21X1.5 SAFETY (NEEDLE) ×3 IMPLANT
NEEDLE HYPO 25X5/8 SAFETYGLIDE (NEEDLE) ×3 IMPLANT
NS IRRIG 1000ML POUR BTL (IV SOLUTION) ×3 IMPLANT
PACK C SECTION WH (CUSTOM PROCEDURE TRAY) ×3 IMPLANT
PAD ABD 8X10 STRL (GAUZE/BANDAGES/DRESSINGS) ×3 IMPLANT
PAD OB MATERNITY 4.3X12.25 (PERSONAL CARE ITEMS) ×3 IMPLANT
SPONGE GAUZE 4X4 12PLY STER LF (GAUZE/BANDAGES/DRESSINGS) ×3 IMPLANT
STAPLER VISISTAT 35W (STAPLE) IMPLANT
STRIP CLOSURE SKIN 1/2X4 (GAUZE/BANDAGES/DRESSINGS) IMPLANT
SUT CHROMIC 2 0 SH (SUTURE) ×3 IMPLANT
SUT MNCRL 0 VIOLET CTX 36 (SUTURE) ×4 IMPLANT
SUT MONOCRYL 0 CTX 36 (SUTURE) ×8
SUT PDS AB 0 CTX 60 (SUTURE) ×3 IMPLANT
SUT PLAIN 0 NONE (SUTURE) IMPLANT
SUT PLAIN 2 0 (SUTURE) ×2
SUT PLAIN 2 0 XLH (SUTURE) IMPLANT
SUT PLAIN ABS 2-0 CT1 27XMFL (SUTURE) ×1 IMPLANT
SUT VIC AB 4-0 KS 27 (SUTURE) ×3 IMPLANT
SYR 20CC LL (SYRINGE) ×3 IMPLANT
TAPE CLOTH SURG 4X10 WHT LF (GAUZE/BANDAGES/DRESSINGS) ×3 IMPLANT
TOWEL OR 17X24 6PK STRL BLUE (TOWEL DISPOSABLE) ×3 IMPLANT
TRAY FOLEY CATH 14FR (SET/KITS/TRAYS/PACK) ×3 IMPLANT
WATER STERILE IRR 1000ML POUR (IV SOLUTION) ×3 IMPLANT

## 2014-09-04 NOTE — Anesthesia Preprocedure Evaluation (Signed)
Anesthesia Evaluation  Patient identified by MRN, date of birth, ID band Patient awake    Reviewed: Allergy & Precautions, H&P , NPO status , Patient's Chart, lab work & pertinent test results  Airway Mallampati: II  TM Distance: >3 FB Neck ROM: Full    Dental no notable dental hx. (+) Teeth Intact   Pulmonary neg pulmonary ROS,  breath sounds clear to auscultation  Pulmonary exam normal       Cardiovascular negative cardio ROS  Rhythm:Regular Rate:Normal     Neuro/Psych negative neurological ROS  negative psych ROS   GI/Hepatic Neg liver ROS, GERD-  Medicated and Controlled,  Endo/Other  negative endocrine ROS  Renal/GU negative Renal ROS  negative genitourinary   Musculoskeletal negative musculoskeletal ROS (+)   Abdominal (+) - obese,   Peds  Hematology negative hematology ROS (+)   Anesthesia Other Findings   Reproductive/Obstetrics (+) Pregnancy Previous C/Section In active labor                             Anesthesia Physical Anesthesia Plan  ASA: II and emergent  Anesthesia Plan: Spinal   Post-op Pain Management:    Induction:   Airway Management Planned: Natural Airway  Additional Equipment:   Intra-op Plan:   Post-operative Plan:   Informed Consent: I have reviewed the patients History and Physical, chart, labs and discussed the procedure including the risks, benefits and alternatives for the proposed anesthesia with the patient or authorized representative who has indicated his/her understanding and acceptance.     Plan Discussed with: Anesthesiologist, CRNA and Surgeon  Anesthesia Plan Comments:         Anesthesia Quick Evaluation

## 2014-09-04 NOTE — Progress Notes (Cosign Needed)
Subjective: Postpartum Day 0: Cesarean Delivery Patient reports nausea and tolerating PO.    Objective: Vital signs in last 24 hours: Temp:  [97.6 F (36.4 C)-98.2 F (36.8 C)] 98.2 F (36.8 C) (12/16 0700) Pulse Rate:  [71-98] 82 (12/16 0700) Resp:  [18-35] 18 (12/16 0700) BP: (62-113)/(26-77) 106/62 mmHg (12/16 0700) SpO2:  [96 %-100 %] 97 % (12/16 0700) Weight:  [180 lb (81.647 kg)] 180 lb (81.647 kg) (12/16 0600)  Physical Exam:  General: alert and cooperative Lochia: appropriate Uterine Fundus: firm Incision: small drainage noted on honeycomb dressing DVT Evaluation: No evidence of DVT seen on physical exam. Negative Homan's sign. No cords or calf tenderness. No significant calf/ankle edema.   Recent Labs  09/04/14 0205 09/04/14 0718  HGB 10.3* 10.1*  HCT 32.9* 31.1*    Assessment/Plan: Status post Cesarean section. Doing well postoperatively.  Continue current care.  Mishika Flippen G 09/04/2014, 8:59 AM

## 2014-09-04 NOTE — Anesthesia Postprocedure Evaluation (Signed)
  Anesthesia Post-op Note  Patient: [name]  Procedure(s) Performed: Procedure(s): CESAREAN SECTION (N/A)  Patient Location: PACU  Anesthesia Type:Spinal  Level of Consciousness: awake, alert  and oriented  Airway and Oxygen Therapy: Patient Spontanous Breathing  Post-op Pain: none  Post-op Assessment: Post-op Vital signs reviewed, Patient's Cardiovascular Status Stable, Respiratory Function Stable, Patent Airway, No signs of Nausea or vomiting, Pain level controlled, No headache, No backache and No residual numbness  Post-op Vital Signs: Reviewed and stable  Last Vitals:  Filed Vitals:   09/04/14 0400  BP: 103/55  Temp: 36.4 C    Complications: No apparent anesthesia complications

## 2014-09-04 NOTE — Transfer of Care (Signed)
Immediate Anesthesia Transfer of Care Note  Patient: [name]  Procedure(s) Performed: Procedure(s): CESAREAN SECTION (N/A)  Patient Location: PACU  Anesthesia Type:Spinal  Level of Consciousness: awake, alert  and oriented  Airway & Oxygen Therapy: Patient Spontanous Breathing  Post-op Assessment: Report given to PACU RN and Post -op Vital signs reviewed and stable  Post vital signs: Reviewed and stable  Complications: No apparent anesthesia complications

## 2014-09-04 NOTE — H&P (Signed)
[name] is a 23 y.o. female presenting for painful UCs. Scheduled for repeat cesarean section in 2 days. Maternal Medical History:  Reason for admission: Contractions.   Contractions: Onset was 1-2 hours ago.    Fetal activity: Perceived fetal activity is normal.      OB History    Gravida Para Term Preterm AB TAB SAB Ectopic Multiple Living   2 0 0 0 0 0 0 0  1     History reviewed. No pertinent past medical history. Past Surgical History  Procedure Laterality Date  . Back surgery    . Cesarean section    . Cyst excision Bilateral     spinal   Family History: family history is not on file. Social History:  reports that she has never smoked. She does not have any smokeless tobacco history on file. She reports that she does not drink alcohol or use illicit drugs.   Prenatal Transfer Tool  Maternal Diabetes: No Genetic Screening: Normal Maternal Ultrasounds/Referrals: Normal Fetal Ultrasounds or other Referrals:  None Maternal Substance Abuse:  No Significant Maternal Medications:  None Significant Maternal Lab Results:  None Other Comments:  None  Review of Systems  Eyes: Negative for blurred vision.  Gastrointestinal: Negative for abdominal pain.  Neurological: Negative for headaches.    Dilation: 4 Effacement (%): 80 Station: -3 Exam by:: Judie PetitDunbar RN [phone] Last menstrual period 11/22/2013.   Fetal Exam Fetal State Assessment: Category I - tracings are normal.     Physical Exam  Cardiovascular: Normal rate and regular rhythm.   Respiratory: Effort normal.  GI: Soft. There is no tenderness.  Neurological: She has normal reflexes.    Prenatal labs: ABO, Rh:   Antibody:   Rubella:   RPR:    HBsAg:    HIV:    GBS:     Assessment/Plan: 23 yo G2P1 with previous cesarean section desires repeat in labor D/W patient repeat cesarean section and risks including infection, organ damage, bleeding/transfusion-HIV/Hep, DVT/PE, pneumonia. All  questions answered Patient states she understands and agrees   Barbara Keng II,[name] E 09/04/2014, 2:27 AM

## 2014-09-04 NOTE — Progress Notes (Signed)
Order given from Dr Malen GauzeFoster for 80 mcg phenylephrine given at 4:30 for bp of 77/26 unable to chart because of dosage.

## 2014-09-04 NOTE — Progress Notes (Signed)
Notified Dr. Renaldo FiddlerAdkins of orthostatic blood pressure. Patient states she feels fine with no dizziness. Patient up to bathroom and walking around room. Bleeding small amount. Dr. Renaldo FiddlerAdkins ordered CBC for am and encouraged to have patient ambulate in room with assistance overnight. Will continue to monitor. Earl Galasborne, Linda HedgesStefanie Wright-Patterson AFBHudspeth

## 2014-09-04 NOTE — Anesthesia Procedure Notes (Signed)
Spinal Patient location during procedure: OR Start time: 09/04/2014 2:54 AM Staffing Anesthesiologist: Oda Lansdowne A. Performed by: anesthesiologist  Preanesthetic Checklist Completed: patient identified, site marked, surgical consent, pre-op evaluation, timeout performed, IV checked, risks and benefits discussed and monitors and equipment checked Spinal Block Patient position: sitting Prep: site prepped and draped and DuraPrep Patient monitoring: heart rate, cardiac monitor, continuous pulse ox and blood pressure Approach: midline Location: L3-4 Injection technique: single-shot Needle Needle type: Sprotte  Needle gauge: 24 G Needle length: 9 cm Needle insertion depth: 6 cm Assessment Sensory level: T4 Additional Notes Patient tolerated procedure well. Adequate sensory level.

## 2014-09-04 NOTE — Brief Op Note (Signed)
09/04/2014  3:50 AM  PATIENT:  [name]  23 y.o. female  PRE-OPERATIVE DIAGNOSIS:  previous Cesearan Section   POST-OPERATIVE DIAGNOSIS:  Repeat Cesearan Section   PROCEDURE:  Procedure(s): CESAREAN SECTION (N/A)  SURGEON:  Surgeon(s) and Role:    * Leslie AndreaJames E Zaida Reiland II, MD - Primary  PHYSICIAN ASSISTANT:   ASSISTANTS: none   ANESTHESIA:   spinal  EBL:  Total I/O In: 2400 [I.V.:2400] Out: 1400 [Other:200; Blood:1200]  BLOOD ADMINISTERED:none  DRAINS: Urinary Catheter (Foley)   LOCAL MEDICATIONS USED:  OTHER Exparel 20 ml in saline 20 ml  SPECIMEN:  Source of Specimen:  none  DISPOSITION OF SPECIMEN:  N/A  COUNTS:  YES  TOURNIQUET:  * No tourniquets in log *  DICTATION: .Other Dictation: Dictation Number [id number]  PLAN OF CARE: Admit to inpatient   PATIENT DISPOSITION:  PACU - hemodynamically stable.   Delay start of Pharmacological VTE agent (>24hrs) due to surgical blood loss or risk of bleeding: not applicable

## 2014-09-04 NOTE — Op Note (Signed)
[name]:  [name], [name]    ACCOUNT NO.:  [id number]  MEDICAL RECORD NO.:  [id number]  LOCATION:  9133                          FACILITY:  [facility]  PHYSICIAN:  Guy SandiferJames E. Henderson Cloudomblin, M.D. DATE OF BIRTH:  [birth year]  DATE OF PROCEDURE:  09/04/2014 DATE OF DISCHARGE:                              OPERATIVE REPORT   PREOPERATIVE DIAGNOSES: 1. Previous cesarean section. 2. Labor. 3. Desires repeat cesarean section.  POSTOPERATIVE DIAGNOSES: 1. Previous cesarean section. 2. Labor. 3. Desires repeat cesarean section.  SURGEON:  Guy SandiferJames E. Henderson Cloudomblin, MD.  ANESTHESIA:  Mal AmabileMichael Foster, MD.  Spinal.  PROCEDURE:  Repeat low transverse cesarean section.  ESTIMATED BLOOD LOSS:  500 mL.  FINDINGS:  Viable female infant.  Apgars, cord pH and birth weight pending.  INDICATIONS AND CONSENT:  This patient is a 23 year old, G2, P1, at 3939- 3/7th weeks, who presents to MAU in labor with painful contractions and a cervix of approximately 4 cm dilation.  She has a previous cesarean section, desires repeat.  Repeat cesarean section discussed with the patient.  Potential risks and complications were reviewed preoperatively including, but not limited to, infection, organ damage, bleeding requiring transfusion of blood products with HIV and hepatitis acquisition, DVT, PE, pneumonia.  The patient states she understands, agrees with the procedure.  All questions were answered and consent is signed on the chart.  DESCRIPTION OF PROCEDURE:  The patient was taken to the operating room, where she has a spinal anesthetic placed per Dr. Malen GauzeFoster.  She was placed in a dorsal supine position with a 15-degree left lateral wedge.  She was prepped, Foley catheter was placed and the bladder was drained, and draped in a sterile fashion per [facility] protocol.  After testing for adequate spinal anesthesia, skin was entered through a Pfannenstiel incision removing the old scar on the way in.  Dissection was  carried out in layers of the peritoneum which was incised and extended superiorly and inferiorly.  Vesicouterine peritoneum was taken down cephalad laterally.  Bladder flap was developed.  The bladder blade was placed.  Uterus was incised in a low transverse manner and the uterine cavity was entered bluntly with a hemostat.  Uterine incision was extended cephalad laterally with fingers.  Artificial rupture of membranes for clear fluid was carried out.  Vertex was delivered and the remainder of the baby delivered without difficulty.  Good cry and tone was noted.  Cord was clamped and cut.  The baby was handed to awaiting pediatrics team.  Placenta was manually delivered.  The cavity was clean.  Uterus was closed in 2 running locking imbricating layers of 0 Monocryl.  There was a small bleeder and a small hematoma on the inferior portion of the left angle of the uterine incision.  Uterus was therefore delivered through the incision.  Using careful palpation and visualization, an O'Leary suture was placed with the 0 Monocryl suture around the left angle and additional 2-0 chromic was used as well and complete hemostasis was obtained.  Careful inspection revealed no damage to surrounding structures.  The uterus was returned to the abdomen. Reinspection revealed good hemostasis.  Anterior peritoneum was closed in a running fashion with 0 Monocryl suture which was also used to reapproximate the pyramidalis muscle  in midline.  Anterior rectus fascia was closed in a running fashion with a 0 looped PDS suture.  20 mL of Exparel diluted in 20 mL of saline was then injected subfascially and subcutaneously.  Subcutaneous layer was closed with interrupted plain suture and the skin was closed with a subcuticular Vicryl on a Keith needle.  Steri-Strips were applied.  All counts correct.  The patient was taken to the recovery room in stable condition.     Guy SandiferJames E. Henderson Cloudomblin, M.D.     JET/MEDQ  D:   09/04/2014  T:  09/04/2014  Job:  [id number]

## 2014-09-04 NOTE — Lactation Note (Signed)
This note was copied from the chart of [name]. Lactation Consultation Note  [facility] [employer] [facility] and would like breastpump before discharge. Mother latched baby in football in her cute outfit and bunny slippers. Sucks and swallows observed. Suggest that if baby becomes sleepy at the breast, mother should undress her to feed. Reviewed cluster feeding.  Mom encouraged to feed baby 8-12 times/24 hours and with feeding cues.  Mom made aware of O/P services, breastfeeding support groups, community resources, and our phone # for post-discharge questions.    Patient Name: [name] ZOXWR'UToday's Date: 09/04/2014 Reason for consult: Initial assessment   Maternal Data Has patient been taught Hand Expression?: Yes Does the patient have breastfeeding experience prior to this delivery?: Yes  Feeding Feeding Type: Breast Fed Length of feed: 30 min  LATCH Score/Interventions Latch: Grasps breast easily, tongue down, lips flanged, rhythmical sucking. Intervention(s): Breast massage  Audible Swallowing: A few with stimulation  Type of Nipple: Everted at rest and after stimulation  Comfort (Breast/Nipple): Soft / non-tender     Hold (Positioning): No assistance needed to correctly position infant at breast.  LATCH Score: 9  Lactation Tools Discussed/Used     Consult Status Consult Status: Follow-up Date: 09/05/14 Follow-up type: In-patient    Dahlia ByesBerkelhammer, Densil [facility] 09/04/2014, 8:59 PM

## 2014-09-05 ENCOUNTER — Encounter (HOSPITAL_COMMUNITY): Payer: Self-pay | Admitting: Obstetrics and Gynecology

## 2014-09-05 ENCOUNTER — Other Ambulatory Visit (HOSPITAL_COMMUNITY): Payer: 59

## 2014-09-05 LAB — CBC
HCT: 26.6 % — ABNORMAL LOW (ref 36.0–46.0)
HEMOGLOBIN: 8.2 g/dL — AB (ref 12.0–15.0)
MCH: 25.5 pg — ABNORMAL LOW (ref 26.0–34.0)
MCHC: 30.8 g/dL (ref 30.0–36.0)
MCV: 82.9 fL (ref 78.0–100.0)
PLATELETS: 226 10*3/uL (ref 150–400)
RBC: 3.21 MIL/uL — AB (ref 3.87–5.11)
RDW: 14.7 % (ref 11.5–15.5)
WBC: 12.1 10*3/uL — AB (ref 4.0–10.5)

## 2014-09-05 LAB — BIRTH TISSUE RECOVERY COLLECTION (PLACENTA DONATION)

## 2014-09-05 NOTE — Addendum Note (Signed)
Addendum  created 09/05/14 0803 by Graciela HusbandsWynn O Hanae Waiters, CRNA   Modules edited: Notes Section   Notes Section:  File: [id number]

## 2014-09-05 NOTE — Anesthesia Postprocedure Evaluation (Signed)
Anesthesia Post Note  Patient: [name]  Procedure(s) Performed: Procedure(s) (LRB): CESAREAN SECTION (N/A)  Anesthesia type: Spinal  Patient location: Mother/Baby  Post pain: Pain level controlled  Post assessment: Post-op Vital signs reviewed  Last Vitals:  Filed Vitals:   09/05/14 0632  BP: 96/58  Pulse: 75  Temp: 36.9 C  Resp: 16    Post vital signs: Reviewed  Level of consciousness: awake  Complications: No apparent anesthesia complications

## 2014-09-05 NOTE — Progress Notes (Signed)
Subjective: Postpartum Day 1: Cesarean Delivery Patient reports tolerating PO, + flatus and no problems voiding.    Objective: Vital signs in last 24 hours: Temp:  [98.2 F (36.8 C)-98.8 F (37.1 C)] 98.5 F (36.9 C) (12/17 52840632) Pulse Rate:  [70-104] 75 (12/17 0632) Resp:  [16-18] 16 (12/17 13240632) BP: (85-113)/(44-66) 96/58 mmHg (12/17 0632) SpO2:  [95 %-99 %] 97 % (12/16 2028)  Physical Exam:  General: alert and cooperative Lochia: appropriate Uterine Fundus: firm Incision: old drainage noted on bandage DVT Evaluation: No evidence of DVT seen on physical exam. Negative Homan's sign. No cords or calf tenderness. No significant calf/ankle edema.   Recent Labs  09/04/14 0718 09/05/14 0600  HGB 10.1* 8.2*  HCT 31.1* 26.6*    Assessment/Plan: Status post Cesarean section. Doing well postoperatively.  Continue current care.  [name] G 09/05/2014, 8:42 AM

## 2014-09-06 ENCOUNTER — Encounter (HOSPITAL_COMMUNITY): Admission: AD | Payer: Self-pay | Source: Ambulatory Visit

## 2014-09-06 ENCOUNTER — Inpatient Hospital Stay (HOSPITAL_COMMUNITY): Admission: RE | Admit: 2014-09-06 | Payer: 59 | Source: Ambulatory Visit | Admitting: Obstetrics and Gynecology

## 2014-09-06 ENCOUNTER — Encounter (HOSPITAL_COMMUNITY): Admission: RE | Payer: Self-pay | Source: Ambulatory Visit

## 2014-09-06 ENCOUNTER — Inpatient Hospital Stay (HOSPITAL_COMMUNITY): Admission: AD | Admit: 2014-09-06 | Payer: 59 | Source: Ambulatory Visit | Admitting: Obstetrics and Gynecology

## 2014-09-06 DIAGNOSIS — Z3490 Encounter for supervision of normal pregnancy, unspecified, unspecified trimester: Secondary | ICD-10-CM

## 2014-09-06 SURGERY — Surgical Case
Anesthesia: Regional

## 2014-09-06 NOTE — Progress Notes (Signed)
Subjective: Postpartum Day 2: Cesarean Delivery Patient reports tolerating PO, + flatus and no problems voiding.    Objective: Vital signs in last 24 hours: Temp:  [97.9 F (36.6 C)-98.1 F (36.7 C)] 97.9 F (36.6 C) (12/18 0534) Pulse Rate:  [83-91] 83 (12/18 0534) Resp:  [16] 16 (12/18 0534) BP: (92-100)/(47-66) 100/47 mmHg (12/18 0534)  Physical Exam:  General: alert and cooperative Lochia: appropriate Uterine Fundus: firm Incision: healing well DVT Evaluation: No evidence of DVT seen on physical exam. Negative Homan's sign. No cords or calf tenderness. No significant calf/ankle edema.   Recent Labs  09/04/14 0718 09/05/14 0600  HGB 10.1* 8.2*  HCT 31.1* 26.6*    Assessment/Plan: Status post Cesarean section. Doing well postoperatively. Discharge home. Unable to sign orders. Admission orders not completed.  Raynee Mccasland G 09/06/2014, 8:35 AM

## 2014-09-06 NOTE — Lactation Note (Signed)
This note was copied from the chart of Madison Sharlisa Valderrama-Uribe. Lactation Consultation Note Mom states nipples are a little sore. Given comfort gels. States first couple of suckle tender, but gets better. Knows how to pull the chin down for deeper latch. May get to go home today or tomorrow. Discussed keeping baby close to breast during feeding, and not feeding baby wrapped in blankets, doing STS when possible. Can express milk well, encouraged to rub on nipples. Discussed store hours for supplies. Reminded of LC OP services. Had 8 pees and 10 stools since birth. Adequate I&). Supplemented once d/t sorenss. Discussed supply and demand and supplementing can lower milk supply. Patient Name: [name] Reason for consult: Follow-up assessment   Maternal Data    Feeding Feeding Type: Breast Fed Length of feed: 10 min  LATCH Score/Interventions                      Lactation Tools Discussed/Used     Consult Status Consult Status: Complete Date: 09/06/14 Follow-up type: Call as needed    [name], [name] [name], 7:31 AM

## 2014-09-06 NOTE — Discharge Summary (Signed)
Obstetric Discharge Summary Reason for Admission: onset of labor Prenatal Procedures: ultrasound Intrapartum Procedures: cesarean: low cervical, transverse Postpartum Procedures: none Complications-Operative and Postpartum: none HEMOGLOBIN  Date Value Ref Range Status  09/05/2014 8.2* 12.0 - 15.0 g/dL Final    Comment:    REPEATED TO VERIFY DELTA CHECK NOTED   02/05/2014 13.5 g/dL Final   HCT  Date Value Ref Range Status  09/05/2014 26.6* 36.0 - 46.0 % Final    Physical Exam:  General: alert and cooperative Lochia: appropriate Uterine Fundus: firm Incision: healing well DVT Evaluation: No evidence of DVT seen on physical exam. Negative Homan's sign. No cords or calf tenderness. No significant calf/ankle edema.  Discharge Diagnoses: Term Pregnancy-delivered  Discharge Information: Date: 09/06/2014 Activity: pelvic rest Diet: routine Medications: PNV, Ibuprofen and Percocet Condition: stable Instructions: refer to practice specific booklet Discharge to: home   Newborn Data: Live born female  Birth Weight: 9 lb 0.8 oz (4105 g) APGAR: 8,   Home with mother.  Senay Sistrunk G 09/06/2014, 8:34 AM

## 2014-09-06 NOTE — Lactation Note (Signed)
This note was copied from the chart of [name]. Called to room during night for Mom's complaint of soreness.  Was able to express copious amounts of colostrum, taught Mom hand expression.  Comfort gels given with instructions on use.  Mom expresses understanding and relief.

## 2014-09-18 ENCOUNTER — Encounter (HOSPITAL_COMMUNITY): Payer: Self-pay | Admitting: *Deleted

## 2015-06-29 IMAGING — US US ABDOMEN LIMITED
1 series · 14 of 25 positions shown · non-contrast
Comparison: None.

CLINICAL DATA: Postprandial right upper quadrant pain.

EXAM:
US ABDOMEN LIMITED - RIGHT UPPER QUADRANT

[Series 1: us abdomen limited · 0.25mm/px · 14 of 45 slices shown]
[im 1/45]
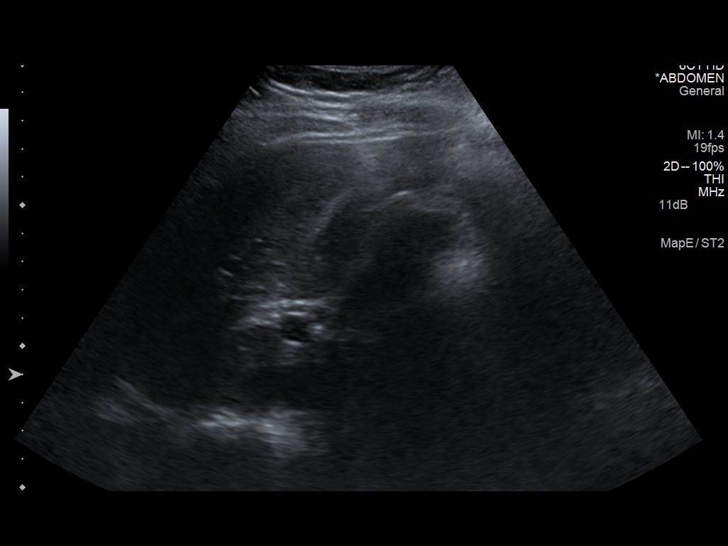
[im 4/45]
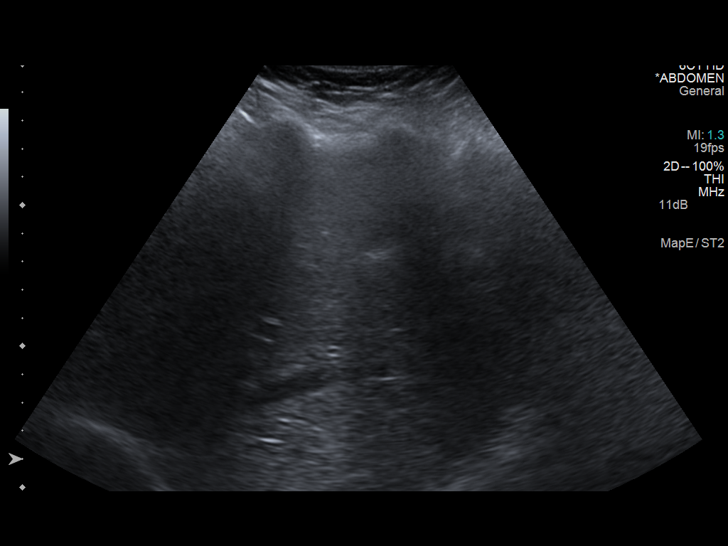
[im 8/45]
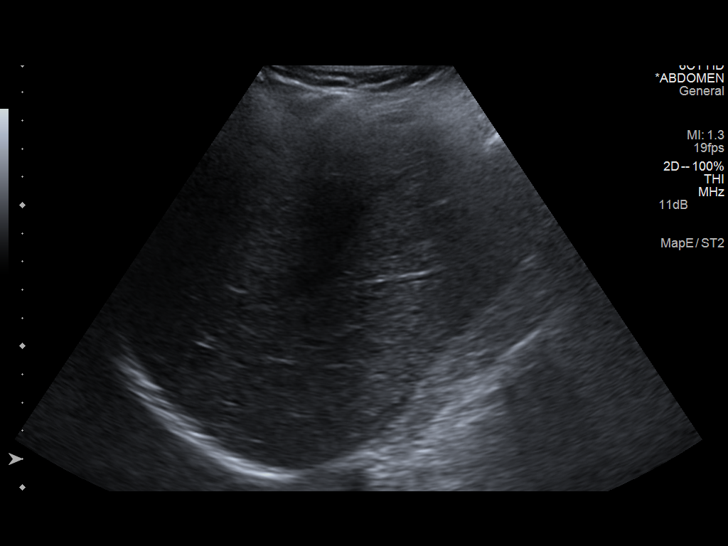
[im 12/45]
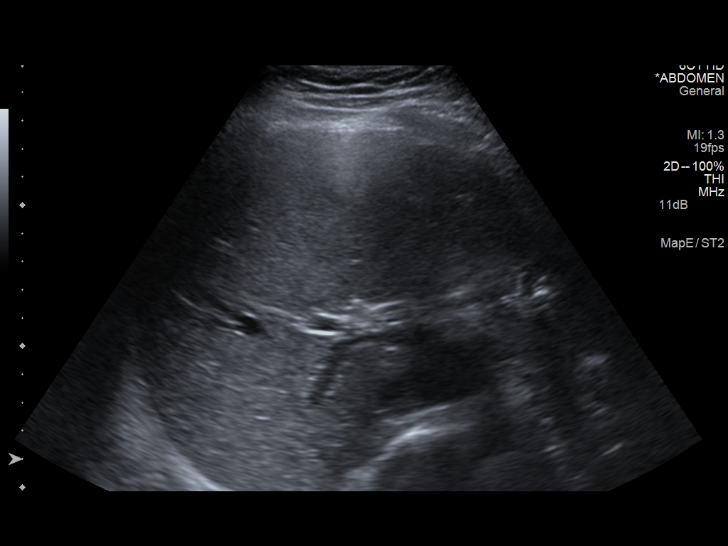
[im 15/45]
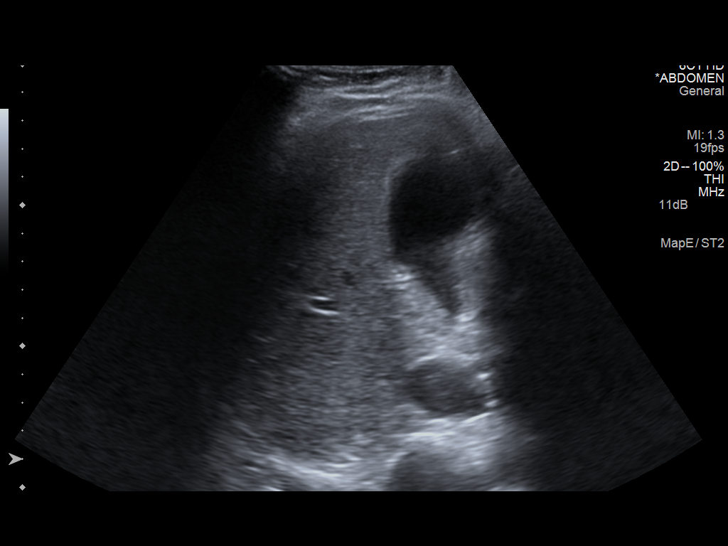
[im 17/45]
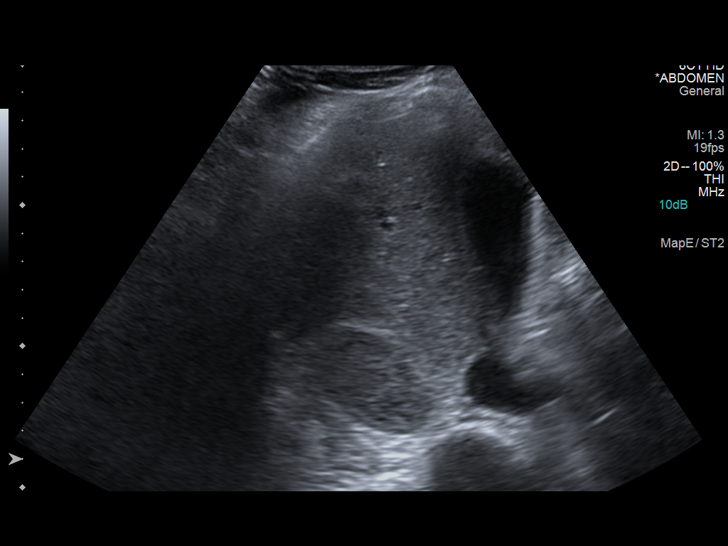
[im 21/45]
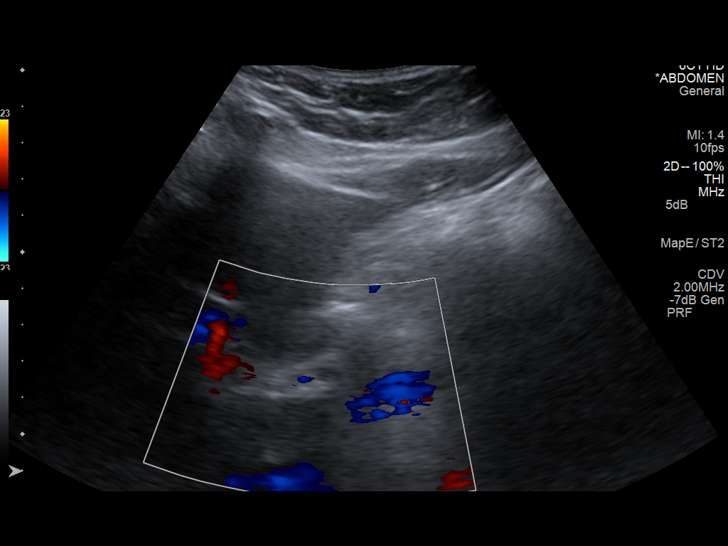
[im 24/45]
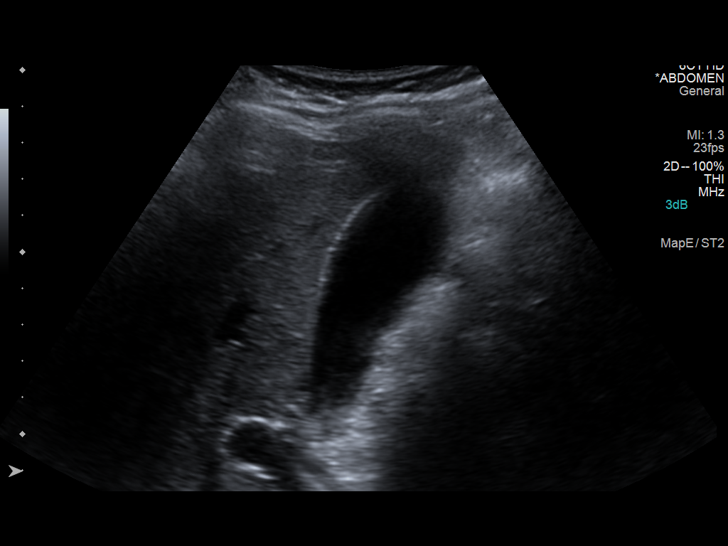
[im 28/45]
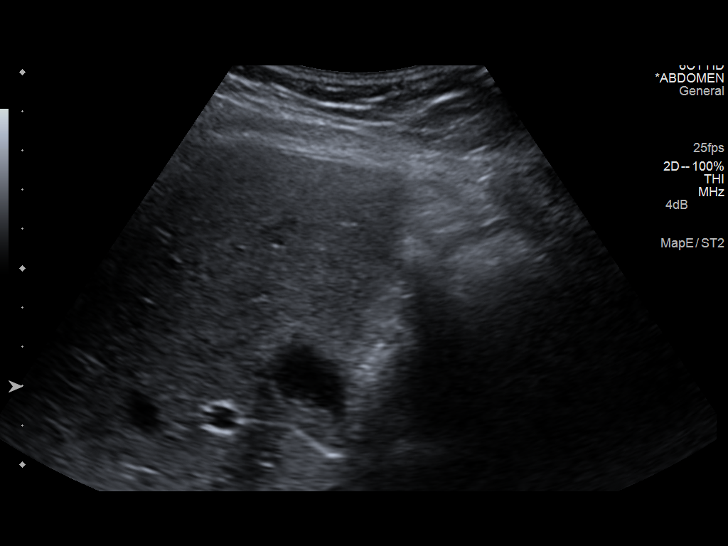
[im 30/45]
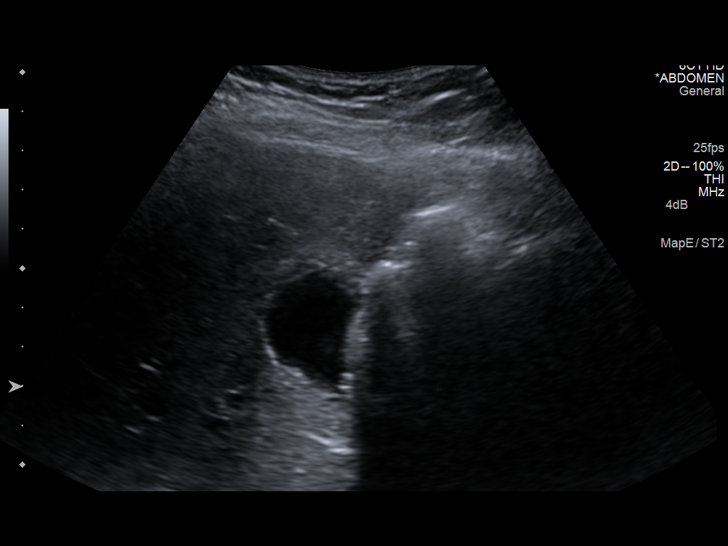
[im 34/45]
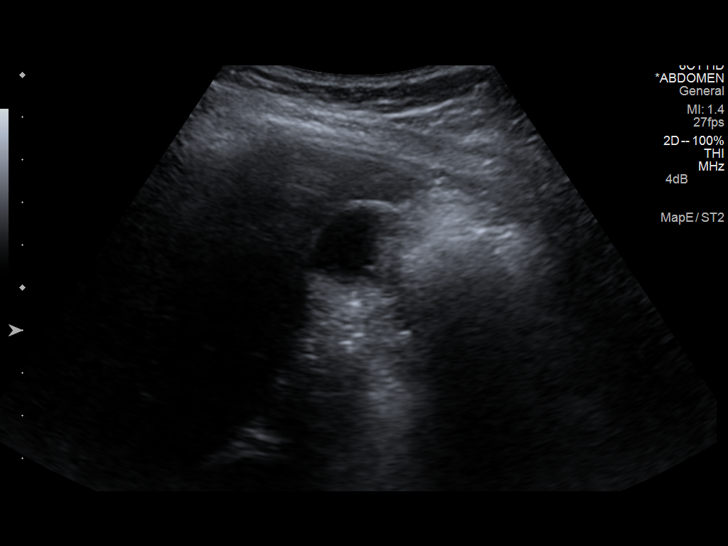
[im 37/45]
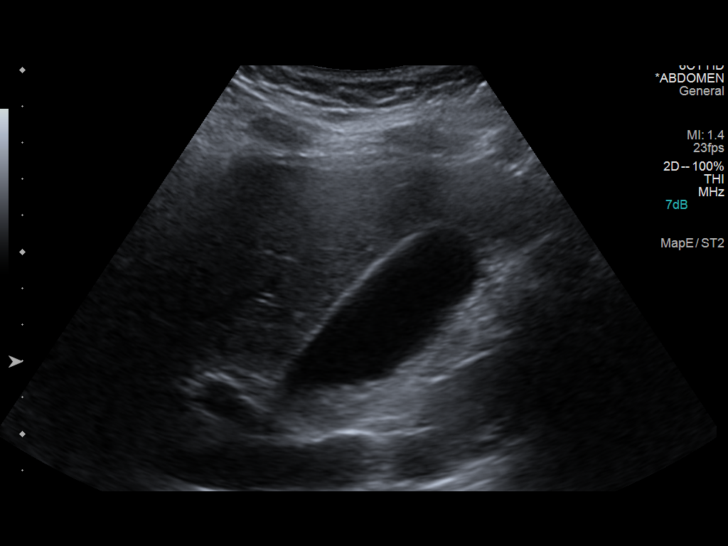
[im 41/45]
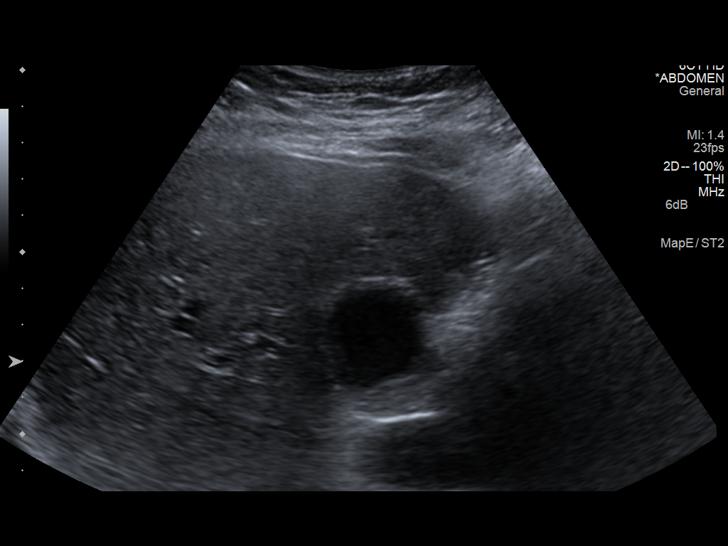
[im 45/45]
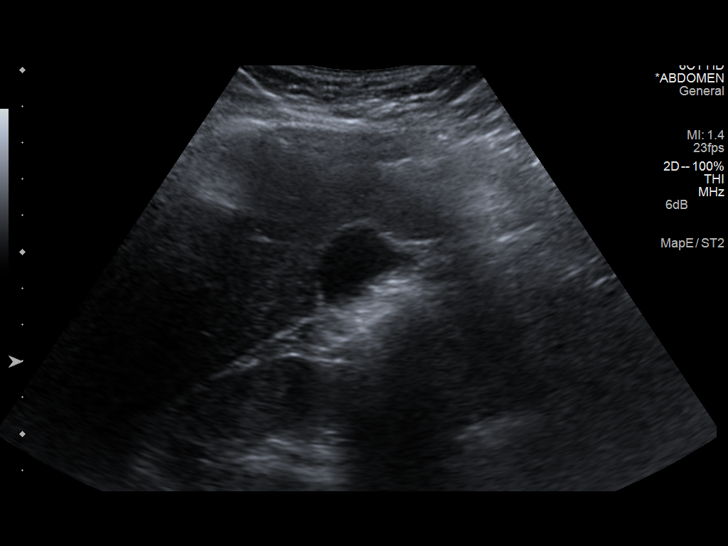

[14 of 25 positions shown; findings below may reference images not displayed]

FINDINGS: Gallbladder:

Layering gallbladder sludge noted, however no discrete gallstones
are identified. No evidence of gallbladder wall thickening or
pericholecystic fluid. No sonographic Murphy sign noted by
sonographer.

Common bile duct:

Diameter: 4 mm

Liver:

No focal lesion identified. Within normal limits in parenchymal
echogenicity.
IMPRESSION: Gallbladder sludge, without discrete gallstones or biliary
dilatation.

## 2021-08-03 ENCOUNTER — Other Ambulatory Visit: Payer: Self-pay | Admitting: Obstetrics and Gynecology

## 2021-08-03 DIAGNOSIS — N6321 Unspecified lump in the left breast, upper outer quadrant: Secondary | ICD-10-CM

## 2021-08-04 ENCOUNTER — Other Ambulatory Visit: Payer: Self-pay | Admitting: Obstetrics and Gynecology

## 2021-08-04 DIAGNOSIS — N63 Unspecified lump in unspecified breast: Secondary | ICD-10-CM

## 2021-08-20 ENCOUNTER — Ambulatory Visit
Admission: RE | Admit: 2021-08-20 | Discharge: 2021-08-20 | Disposition: A | Discharge status: Home / Self Care | Payer: 59 | Source: Ambulatory Visit | Attending: Obstetrics and Gynecology | Admitting: Obstetrics and Gynecology

## 2021-08-20 ENCOUNTER — Other Ambulatory Visit: Payer: Self-pay | Admitting: Obstetrics and Gynecology

## 2021-08-20 DIAGNOSIS — N6310 Unspecified lump in the right breast, unspecified quadrant: Secondary | ICD-10-CM

## 2021-08-20 DIAGNOSIS — N63 Unspecified lump in unspecified breast: Secondary | ICD-10-CM

## 2021-08-28 ENCOUNTER — Other Ambulatory Visit: Payer: Self-pay | Admitting: Obstetrics and Gynecology

## 2021-08-28 ENCOUNTER — Ambulatory Visit
Admission: RE | Admit: 2021-08-28 | Discharge: 2021-08-28 | Disposition: A | Discharge status: Home / Self Care | Payer: 59 | Source: Ambulatory Visit | Attending: Obstetrics and Gynecology | Admitting: Obstetrics and Gynecology

## 2021-08-28 DIAGNOSIS — N6310 Unspecified lump in the right breast, unspecified quadrant: Secondary | ICD-10-CM

## 2021-08-28 HISTORY — PX: BREAST BIOPSY: SHX20

## 2022-03-01 ENCOUNTER — Other Ambulatory Visit: Payer: Self-pay | Admitting: Obstetrics and Gynecology

## 2022-03-01 ENCOUNTER — Ambulatory Visit
Admission: RE | Admit: 2022-03-01 | Discharge: 2022-03-01 | Disposition: A | Discharge status: Home / Self Care | Payer: 59 | Source: Ambulatory Visit | Attending: Obstetrics and Gynecology | Admitting: Obstetrics and Gynecology

## 2022-03-01 DIAGNOSIS — N6489 Other specified disorders of breast: Secondary | ICD-10-CM

## 2022-03-01 DIAGNOSIS — N6310 Unspecified lump in the right breast, unspecified quadrant: Secondary | ICD-10-CM

## 2022-09-01 ENCOUNTER — Ambulatory Visit
Admission: RE | Admit: 2022-09-01 | Discharge: 2022-09-01 | Disposition: A | Discharge status: Home / Self Care | Payer: 59 | Source: Ambulatory Visit | Attending: Obstetrics and Gynecology | Admitting: Obstetrics and Gynecology

## 2022-09-01 DIAGNOSIS — N6489 Other specified disorders of breast: Secondary | ICD-10-CM

## 2022-11-28 IMAGING — US US  BREAST BX W/ LOC DEV 1ST LESION IMG BX SPEC US GUIDE*R*
1 series · 12 of 13 positions shown · non-contrast
Comparison: Previous exam(s).
COMPARISON: Previous exam(s).

Addendum:
CLINICAL DATA: 30-year-old who presented with a possible palpable
lump in the UPPER OUTER QUADRANT of the RIGHT breast with negative
imaging in that location. However, a focal asymmetry was present in
the upper breast, and an indeterminate 7 mm mass was identified on
ultrasound within an island fibroglandular tissue at the 12 o'clock
position 4 cm from the nipple. (The fibroglandular tissue likely
accounts for the mammographic asymmetry).

EXAM:
ULTRASOUND GUIDED RIGHT BREAST CORE NEEDLE BIOPSY

[Series 1: us breast bx w/ loc dev 1st lesion img bx spec us  · 0.05mm/px · 12 of 13 slices shown]
[im 1/13]
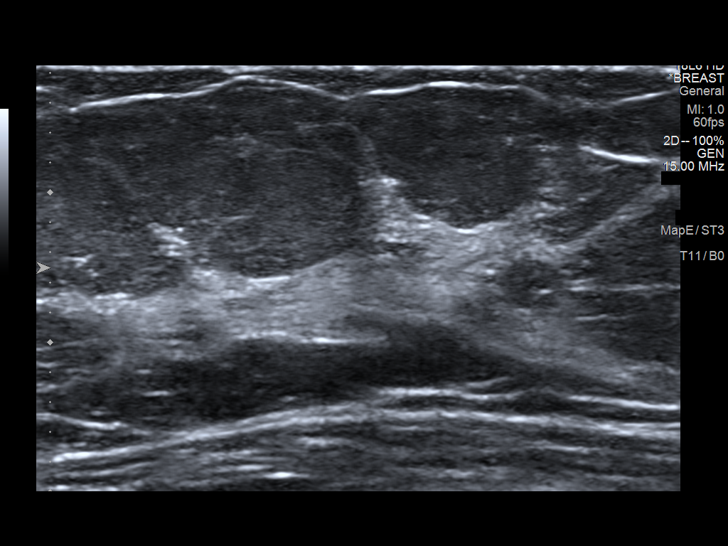
[im 2/13]
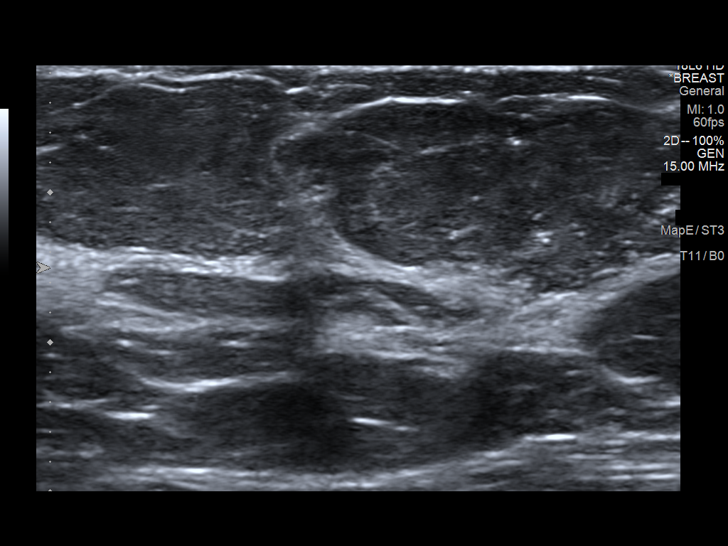
[im 3/13]
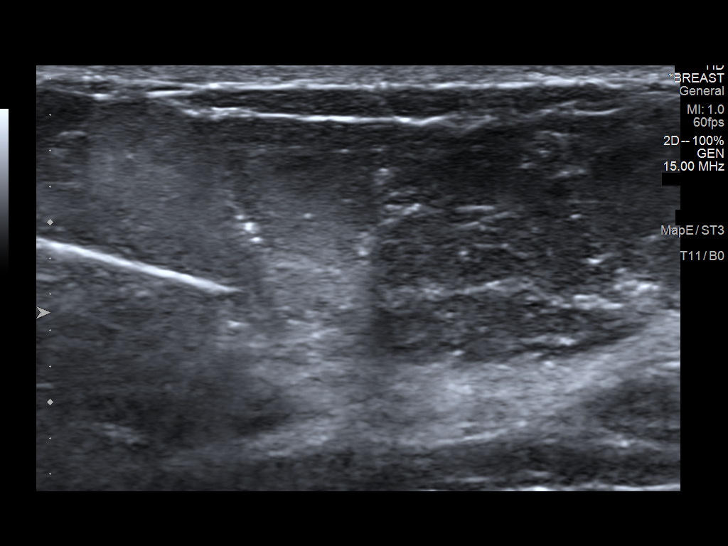
[im 4/13]
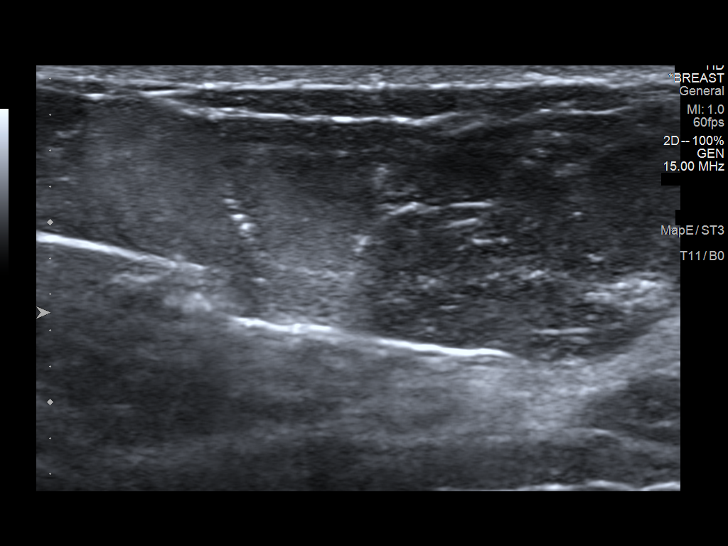
[im 5/13]
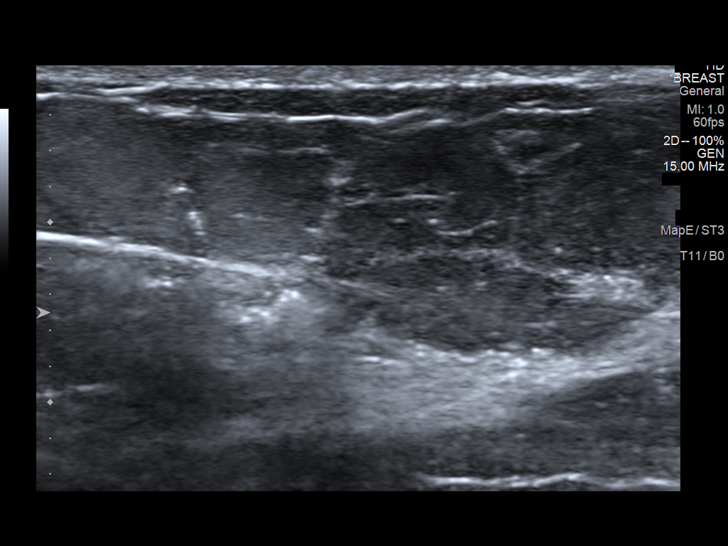
[im 6/13]
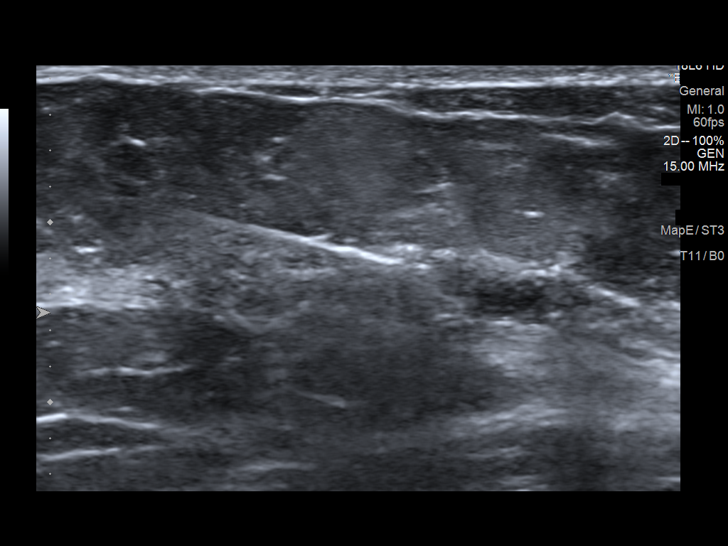
[im 8/13]
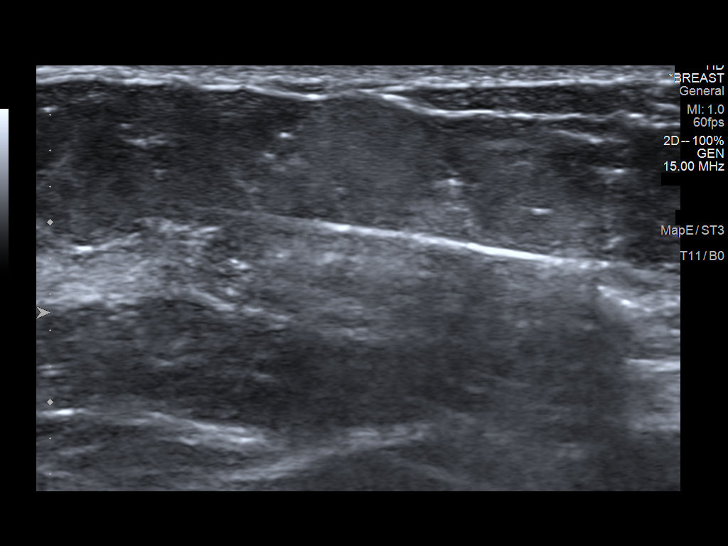
[im 9/13]
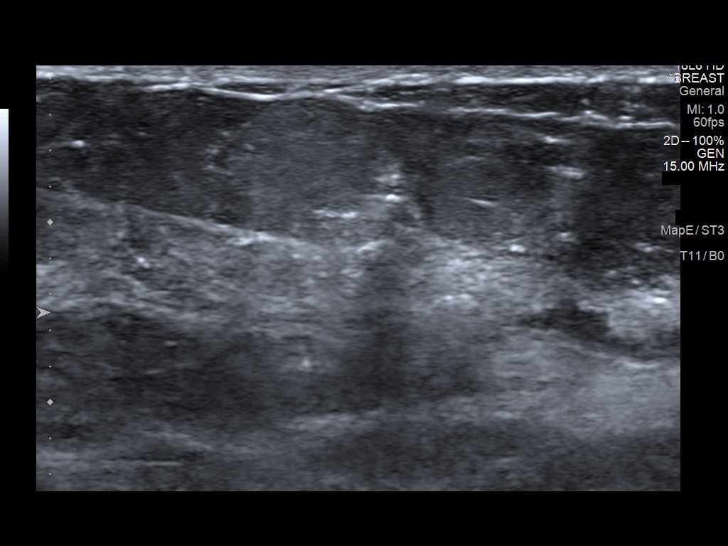
[im 10/13]
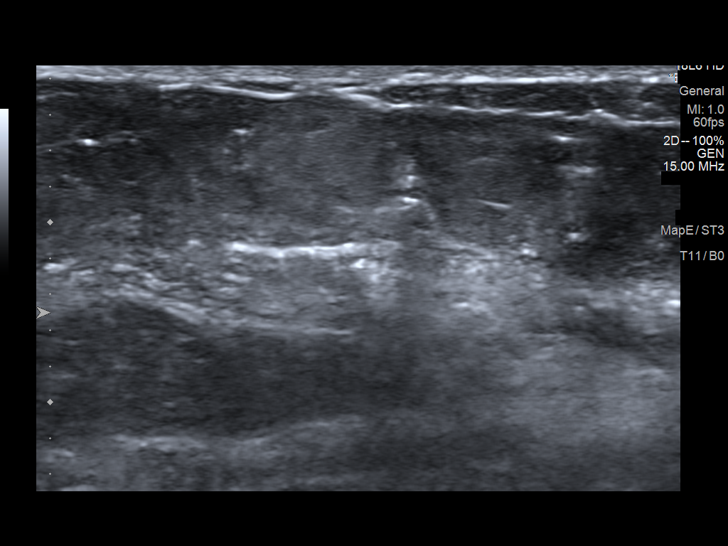
[im 11/13]
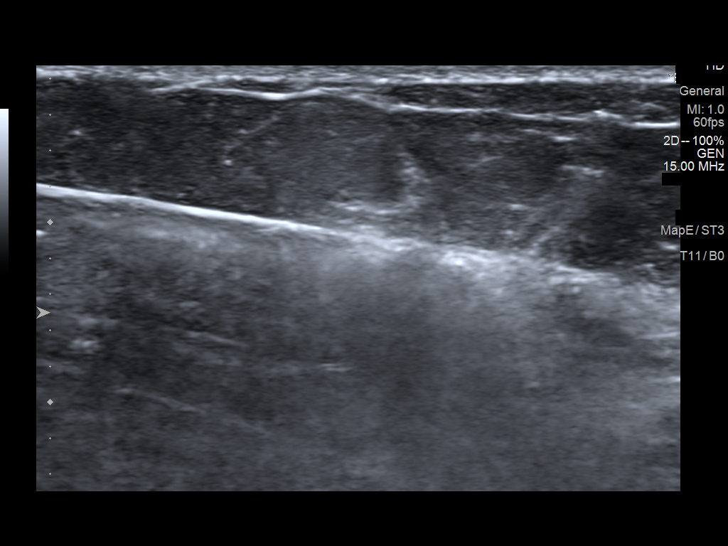
[im 12/13]
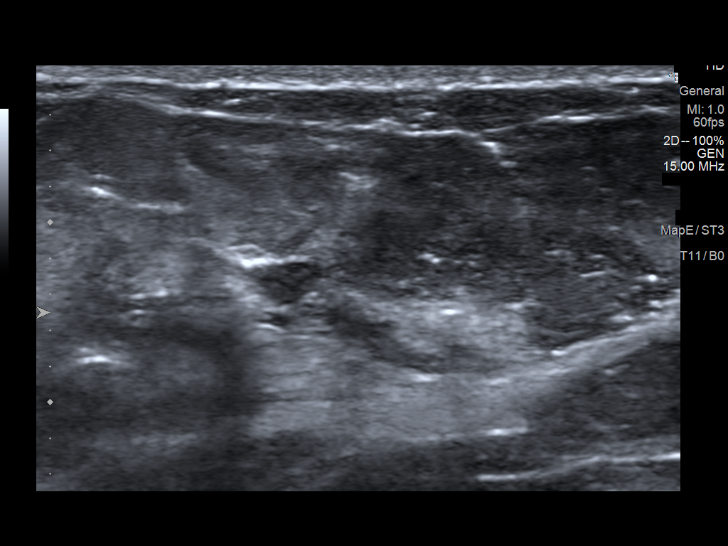
[im 13/13]
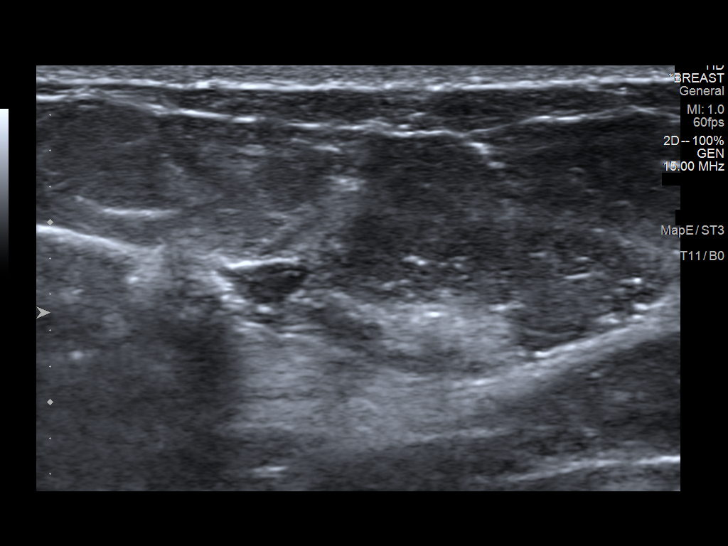

[12 of 13 positions shown; findings below may reference images not displayed]



Lesion quadrant: Upper breast, 12 o'clock location.

Using sterile technique with chlorhexidine skin antisepsis, 1%
Lidocaine as local anesthetic, under direct ultrasound
visualization, a 12 gauge [place] Harona core needle device placed
through an 11 gauge introducer needle was used to perform biopsy of
the mass within the island of fibroglandular tissue in the upper
breast at the 12 o'clock location using a lateral approach.

At the conclusion of the procedure a ribbon shaped tissue marker
clip was deployed into the biopsy cavity. Follow up 2 view mammogram
was performed and dictated separately.
IMPRESSION: Ultrasound guided biopsy of an indeterminate mass within an island
of fibroglandular tissue in the upper RIGHT breast. No apparent
complications.

ADDENDUM:
Pathology revealed BENIGN BREAST TISSUE- NO ATYPIA OR MALIGNANCY
IDENTIFIED of the RIGHT breast, upper (ribbon clip). This was found
to be concordant by Dr. Zmalu Capentes.

Pathology results were discussed with the patient by telephone with
Jongmoon Teriana RN. The patient reported doing well after the biopsy
with tenderness at the site. Post biopsy instructions and care were
reviewed and questions were answered. The patient was encouraged to
call The [REDACTED] for any additional
concerns.

The patient was instructed to return for right diagnostic
mammography in 6 months because what was seen on ultrasound and
biopsied didn't correspond to the mammographic asymmetry (which
looks like normal tissue). She was informed a reminder notice would
be sent regarding this appointment.

Pathology results reported by Acts Iballa RN on 09/01/2021.



Lesion quadrant: Upper breast, 12 o'clock location.

Using sterile technique with chlorhexidine skin antisepsis, 1%
Lidocaine as local anesthetic, under direct ultrasound
visualization, a 12 gauge [place] Harona core needle device placed
through an 11 gauge introducer needle was used to perform biopsy of
the mass within the island of fibroglandular tissue in the upper
breast at the 12 o'clock location using a lateral approach.

At the conclusion of the procedure a ribbon shaped tissue marker
clip was deployed into the biopsy cavity. Follow up 2 view mammogram
was performed and dictated separately.
IMPRESSION: Ultrasound guided biopsy of an indeterminate mass within an island
of fibroglandular tissue in the upper RIGHT breast. No apparent
complications.

## 2022-11-28 IMAGING — MG MM BREAST LOCALIZATION CLIP
4 series · 4 of 12 positions shown · non-contrast
Comparison: Previous exam(s).

CLINICAL DATA: Confirmation of clip placement after
ultrasound-guided core needle biopsy of an indeterminate mass within
an island of fibroglandular tissue in the upper RIGHT breast at the
12 o'clock location.

EXAM:
2D and 3D DIAGNOSTIC RIGHT MAMMOGRAM POST ULTRASOUND BIOPSY

[R ML synth-2D]
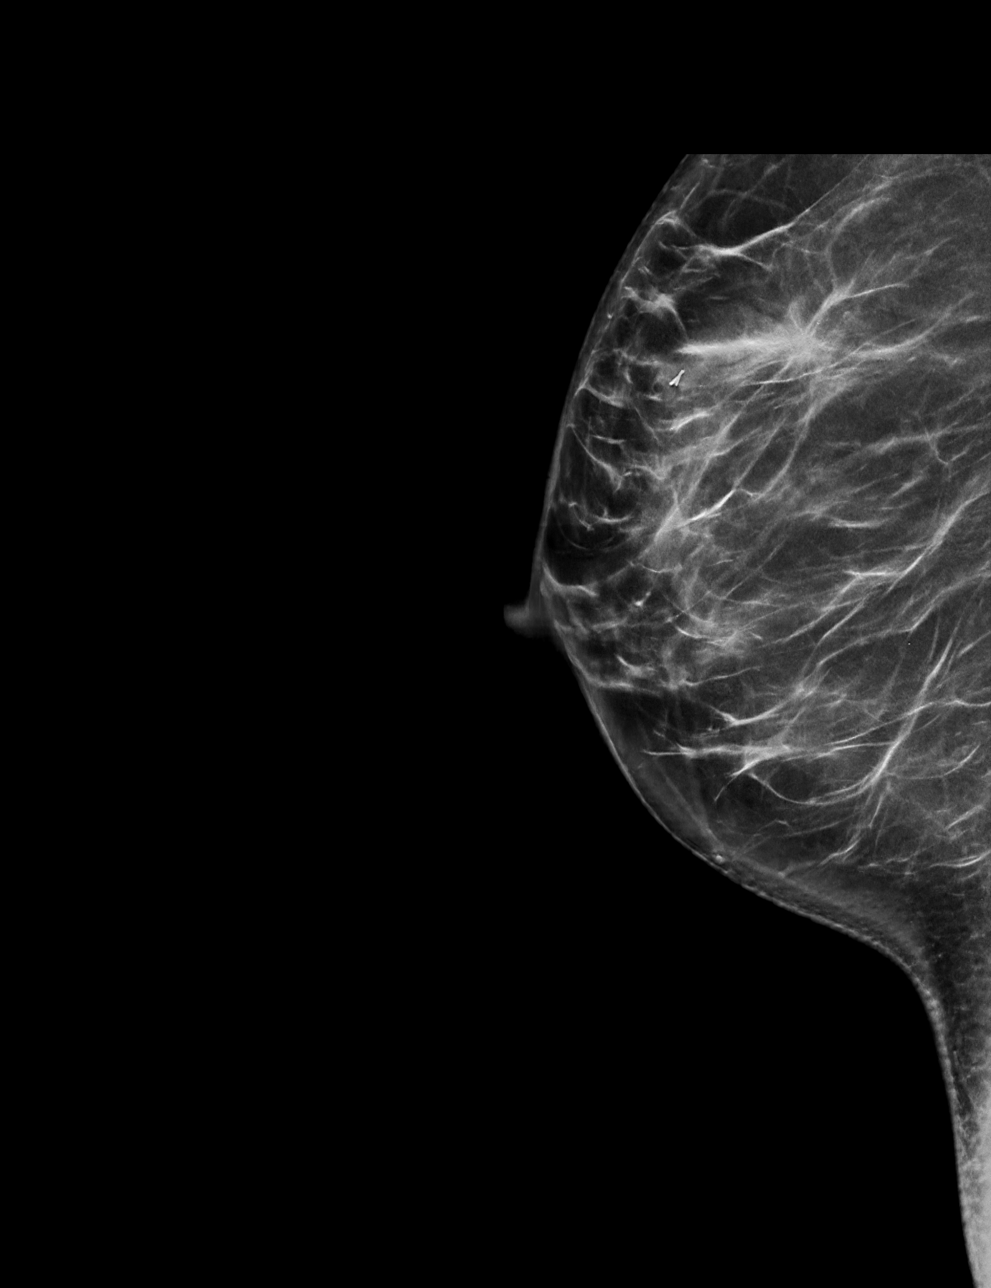

[R CC synth-2D]
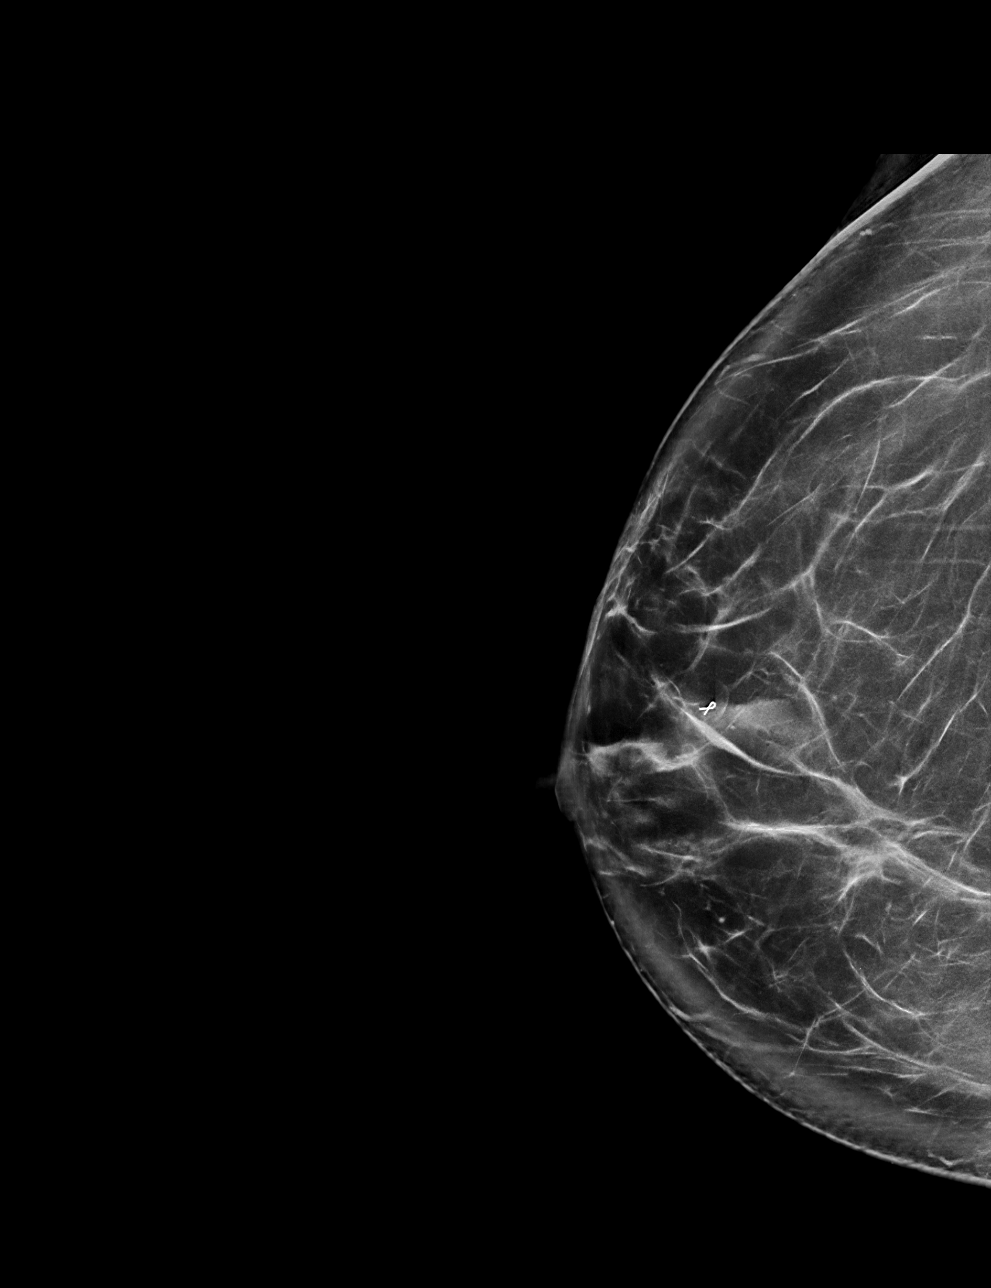

[R ML tomo · tomo slice 38/75.0]
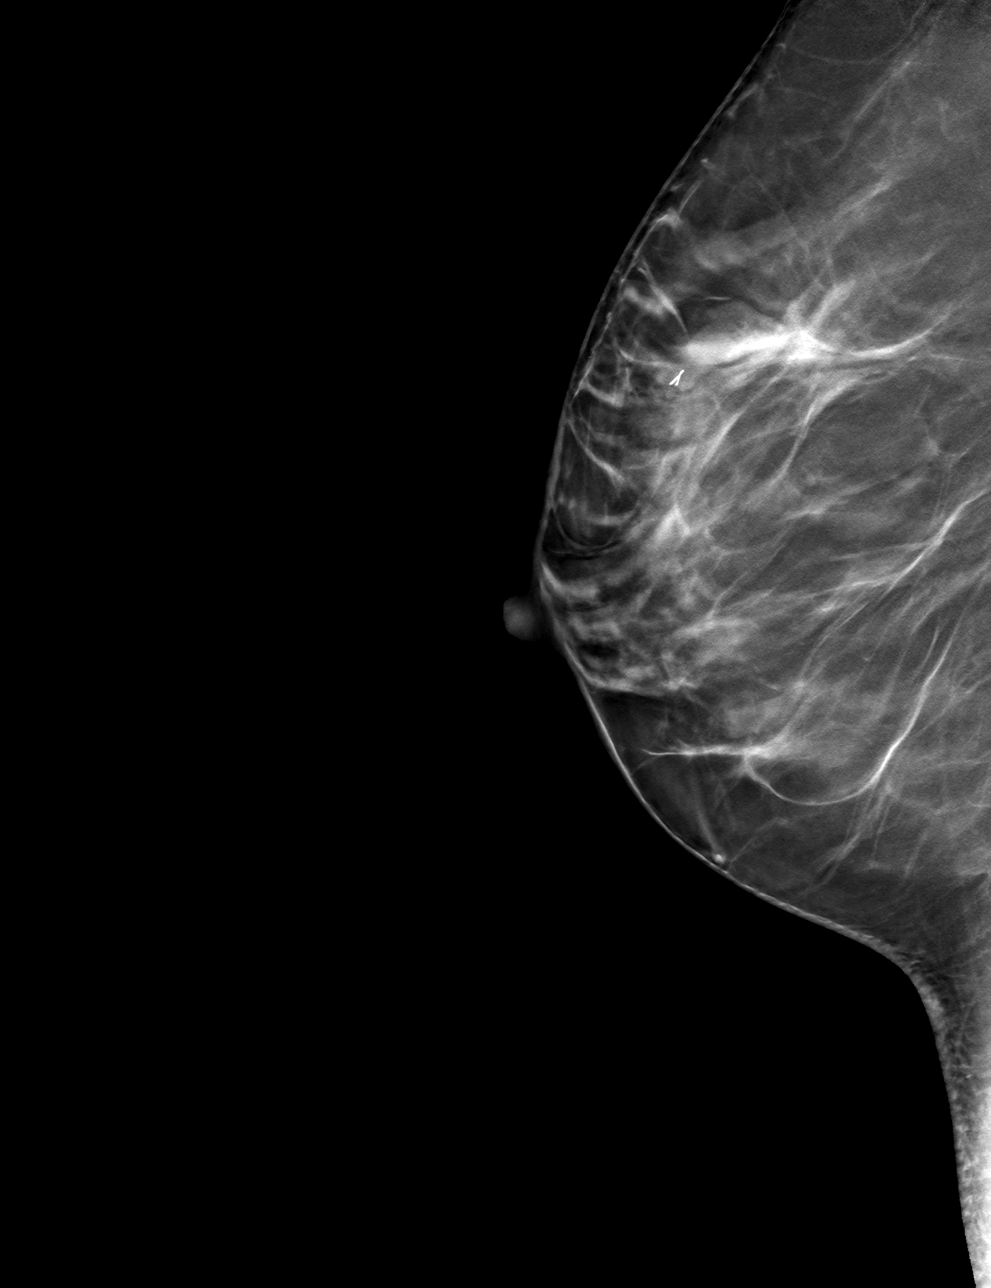

[R CC tomo · tomo slice 43/84.0]
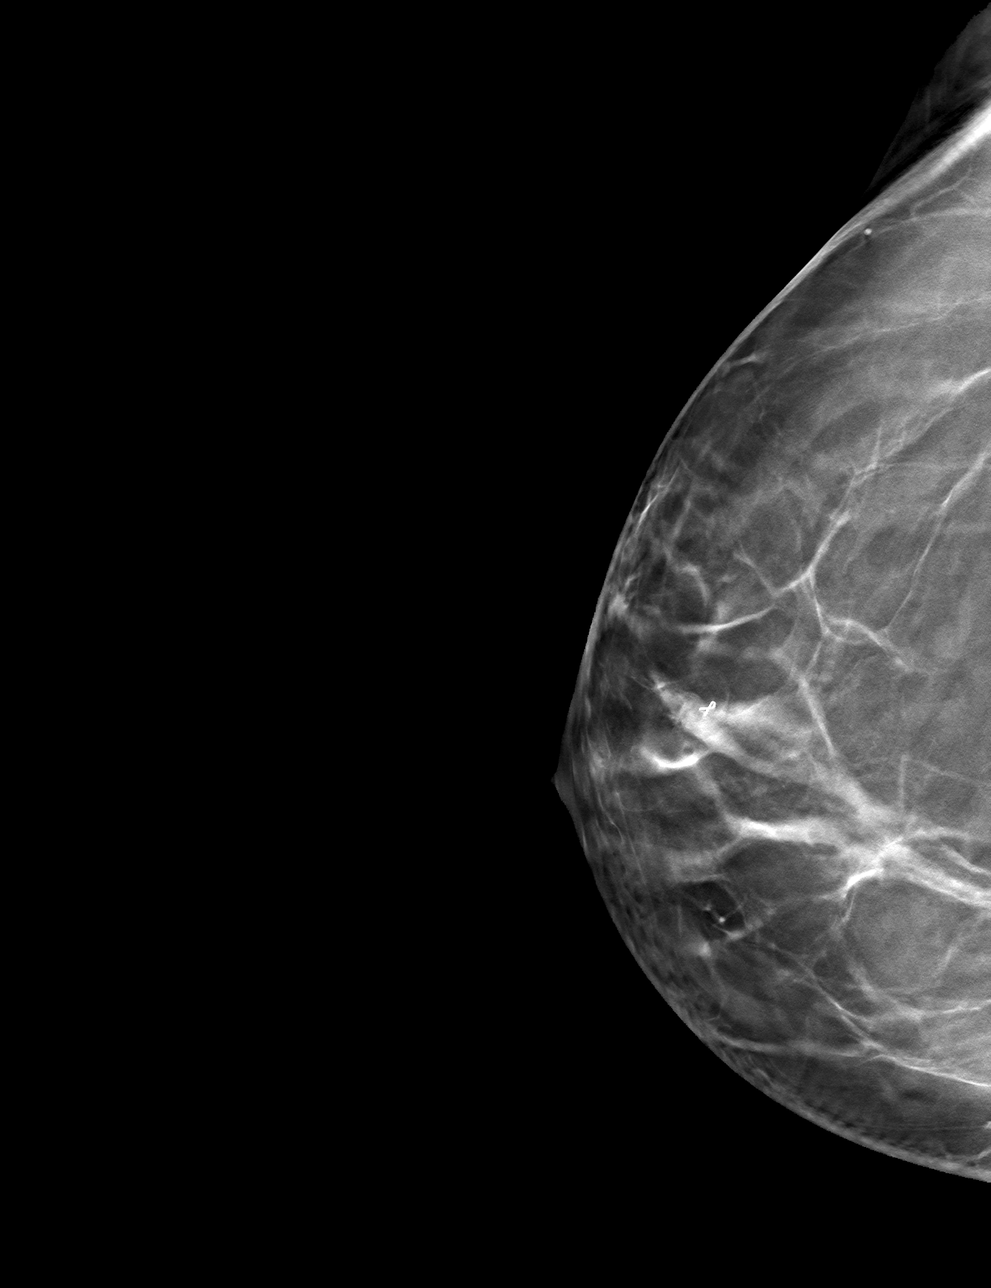

[4 of 12 positions shown; findings below may reference images not displayed]

FINDINGS: 2D and 3D full field CC and mediolateral images were obtained
following ultrasound guided biopsy of a mass within an island of
fibroglandular tissue in the upper RIGHT breast. The ribbon shaped
tissue marking clip is appropriately positioned at the site of the
biopsied mass in the upper breast. However, the biopsied mass does
not correlate with the focal asymmetry seen on mammography which
likely represents an island of fibroglandular tissue.

Expected post biopsy changes present without evidence of hematoma.
IMPRESSION: 1. Appropriate positioning of the ribbon shaped biopsy marking clip
at the site of the biopsied in the upper RIGHT breast.
2. The biopsied mass in the upper breast does not correlate with the
mammographic focal asymmetry (which likely represents an island of
fibroglandular tissue). Therefore, a follow-up diagnostic RIGHT
mammogram in 6 months is recommended.

The above recommendation was discussed with the patient and the
follow-up diagnostic mammogram has been scheduled.

Final Assessment: Post Procedure Mammograms for Marker Placement

## 2023-04-14 ENCOUNTER — Other Ambulatory Visit: Payer: Self-pay | Admitting: Obstetrics and Gynecology

## 2023-04-14 DIAGNOSIS — N6489 Other specified disorders of breast: Secondary | ICD-10-CM

## 2023-09-05 ENCOUNTER — Ambulatory Visit
Admission: RE | Admit: 2023-09-05 | Discharge: 2023-09-05 | Disposition: A | Discharge status: Home / Self Care | Payer: 59 | Source: Ambulatory Visit | Attending: Obstetrics and Gynecology | Admitting: Obstetrics and Gynecology

## 2023-09-05 ENCOUNTER — Other Ambulatory Visit: Payer: Self-pay | Admitting: Obstetrics and Gynecology

## 2023-09-05 DIAGNOSIS — N6489 Other specified disorders of breast: Secondary | ICD-10-CM

## 2024-01-18 ENCOUNTER — Other Ambulatory Visit: Payer: Self-pay | Admitting: Obstetrics and Gynecology

## 2024-01-18 DIAGNOSIS — N6342 Unspecified lump in left breast, subareolar: Secondary | ICD-10-CM

## 2024-01-19 ENCOUNTER — Ambulatory Visit
Admission: RE | Admit: 2024-01-19 | Discharge: 2024-01-19 | Disposition: A | Discharge status: Home / Self Care | Source: Ambulatory Visit | Attending: Obstetrics and Gynecology | Admitting: Obstetrics and Gynecology

## 2024-01-19 DIAGNOSIS — N6342 Unspecified lump in left breast, subareolar: Secondary | ICD-10-CM
